# Patient Record
Sex: Male | Born: 1970 | Race: White | Hispanic: No | Marital: Married | Smoking: Former smoker
Health system: Southern US, Community
[De-identification: ages and names within clinical notes are randomized; demographics above are authoritative.]

## PROBLEM LIST (undated history)

## (undated) DIAGNOSIS — E349 Endocrine disorder, unspecified: Secondary | ICD-10-CM

## (undated) DIAGNOSIS — F419 Anxiety disorder, unspecified: Secondary | ICD-10-CM

## (undated) DIAGNOSIS — F329 Major depressive disorder, single episode, unspecified: Secondary | ICD-10-CM

## (undated) DIAGNOSIS — F32A Depression, unspecified: Secondary | ICD-10-CM

## (undated) DIAGNOSIS — I10 Essential (primary) hypertension: Secondary | ICD-10-CM

## (undated) HISTORY — DX: Depression, unspecified: F32.A

## (undated) HISTORY — PX: NOSE SURGERY: SHX723

## (undated) HISTORY — PX: ANKLE SURGERY: SHX546

## (undated) HISTORY — DX: Major depressive disorder, single episode, unspecified: F32.9

## (undated) HISTORY — DX: Anxiety disorder, unspecified: F41.9

## (undated) HISTORY — DX: Essential (primary) hypertension: I10

## (undated) HISTORY — DX: Endocrine disorder, unspecified: E34.9

---

## 2006-07-15 ENCOUNTER — Ambulatory Visit: Payer: Self-pay | Admitting: Specialist

## 2008-04-12 ENCOUNTER — Ambulatory Visit: Payer: Self-pay | Admitting: Family Medicine

## 2012-12-03 ENCOUNTER — Ambulatory Visit: Payer: Self-pay | Admitting: Emergency Medicine

## 2015-01-21 ENCOUNTER — Other Ambulatory Visit: Payer: Self-pay | Admitting: Family Medicine

## 2015-01-21 DIAGNOSIS — I10 Essential (primary) hypertension: Secondary | ICD-10-CM

## 2015-04-30 ENCOUNTER — Ambulatory Visit (INDEPENDENT_AMBULATORY_CARE_PROVIDER_SITE_OTHER): Payer: BLUE CROSS/BLUE SHIELD | Admitting: Family Medicine

## 2015-04-30 ENCOUNTER — Encounter: Payer: Self-pay | Admitting: Family Medicine

## 2015-04-30 VITALS — BP 120/70 | HR 80 | Ht 70.0 in | Wt 266.0 lb

## 2015-04-30 DIAGNOSIS — F331 Major depressive disorder, recurrent, moderate: Secondary | ICD-10-CM | POA: Diagnosis not present

## 2015-04-30 DIAGNOSIS — E291 Testicular hypofunction: Secondary | ICD-10-CM | POA: Diagnosis not present

## 2015-04-30 DIAGNOSIS — I10 Essential (primary) hypertension: Secondary | ICD-10-CM

## 2015-04-30 DIAGNOSIS — E349 Endocrine disorder, unspecified: Secondary | ICD-10-CM

## 2015-04-30 MED ORDER — TESTOSTERONE CYPIONATE 200 MG/ML IM SOLN: INTRAMUSCULAR | Status: DC

## 2015-04-30 MED ORDER — LISINOPRIL 2.5 MG PO TABS: 2.5000 mg | ORAL_TABLET | Freq: Every day | ORAL | Status: DC

## 2015-04-30 NOTE — Progress Notes (Signed)
Name: Steven Carney   MRN: [id number]    DOB: 12/29/1970   Date:04/30/2015       Progress Note  Subjective  Chief Complaint  Chief Complaint  Patient presents with  . Hypertension  . hypotestosteronism  . Depression    psych is retiring    Hypertension This is a chronic problem. The current episode started more than 1 year ago. The problem has been waxing and waning since onset. The problem is controlled. Pertinent negatives include no anxiety, blurred vision, chest pain, headaches, malaise/fatigue, neck pain, orthopnea, palpitations, peripheral edema, PND, shortness of breath or sweats. Agents associated with hypertension include steroids. Past treatments include ACE inhibitors and diuretics. The current treatment provides moderate improvement. There are no compliance problems.  There is no history of angina, kidney disease, CAD/MI, CVA, heart failure, left ventricular hypertrophy, PVD, renovascular disease or retinopathy. There is no history of chronic renal disease.  Depression        This is a chronic problem.  The current episode started 1 to 4 weeks ago.   The problem occurs daily.  Associated symptoms include decreased concentration and fatigue.  Associated symptoms include no helplessness, no hopelessness, does not have insomnia, not irritable, no restlessness, no decreased interest, no appetite change, no body aches, no myalgias, no headaches, no indigestion, not sad and no suicidal ideas.  Treatments tried: multiple meds.   Pertinent negatives include no anxiety.   No problem-specific assessment & plan notes found for this encounter.   Past Medical History  Diagnosis Date  . Depression   . Hypotestosteronism   . Hypertension     Past Surgical History  Procedure Laterality Date  . Ankle surgery      growth removed  . Nose surgery      fracture    Family History  Problem Relation Age of Onset  . Cancer Mother   . Depression Mother   . Diabetes Mother   .  Hypertension Mother   . Depression Brother   . Heart disease Maternal Grandmother   . Hypertension Maternal Grandmother   . Heart disease Paternal Grandmother   . Hypertension Paternal Grandmother     Social History   Social History  . Marital Status: Married    Spouse Name: N/A  . Number of Children: N/A  . Years of Education: N/A   Occupational History  . Not on file.   Social History Main Topics  . Smoking status: Former Research scientist (life sciences)  . Smokeless tobacco: Not on file  . Alcohol Use: No  . Drug Use: No  . Sexual Activity: Yes   Other Topics Concern  . Not on file   Social History Narrative  . No narrative on file    No Known Allergies   Review of Systems  Constitutional: Positive for fatigue. Negative for fever, chills, weight loss, malaise/fatigue and appetite change.  HENT: Negative for ear discharge, ear pain and sore throat.   Eyes: Negative for blurred vision.  Respiratory: Negative for cough, sputum production, shortness of breath and wheezing.   Cardiovascular: Negative for chest pain, palpitations, orthopnea, leg swelling and PND.  Gastrointestinal: Negative for heartburn, nausea, abdominal pain, diarrhea, constipation, blood in stool and melena.  Genitourinary: Negative for dysuria, urgency, frequency and hematuria.  Musculoskeletal: Negative for myalgias, back pain, joint pain and neck pain.  Skin: Negative for rash.  Neurological: Negative for dizziness, tingling, sensory change, focal weakness and headaches.  Endo/Heme/Allergies: Negative for environmental allergies and polydipsia. Does not bruise/bleed  easily.  Psychiatric/Behavioral: Positive for depression and decreased concentration. Negative for suicidal ideas. The patient is not nervous/anxious and does not have insomnia.      Objective  Filed Vitals:   04/30/15 1338  BP: 120/70  Pulse: 80  Height: 5\' 10"  (1.778 m)  Weight: 266 lb (120.657 kg)    Physical Exam  Constitutional: He is  oriented to person, place, and time and well-developed, well-nourished, and in no distress. He is not irritable.  HENT:  Head: Normocephalic.  Right Ear: External ear normal.  Left Ear: External ear normal.  Nose: Nose normal.  Mouth/Throat: Oropharynx is clear and moist.  Eyes: Conjunctivae and EOM are normal. Pupils are equal, round, and reactive to light. Right eye exhibits no discharge. Left eye exhibits no discharge. No scleral icterus.  Neck: Normal range of motion. Neck supple. No JVD present. No tracheal deviation present. No thyromegaly present.  Cardiovascular: Normal rate, regular rhythm, normal heart sounds and intact distal pulses.  Exam reveals no gallop and no friction rub.   No murmur heard. Pulmonary/Chest: Breath sounds normal. No respiratory distress. He has no wheezes. He has no rales.  Abdominal: Soft. Bowel sounds are normal. He exhibits no mass. There is no hepatosplenomegaly. There is no tenderness. There is no rebound, no guarding and no CVA tenderness.  Musculoskeletal: Normal range of motion. He exhibits no edema or tenderness.  Lymphadenopathy:    He has no cervical adenopathy.  Neurological: He is alert and oriented to person, place, and time. He has normal sensation, normal strength, normal reflexes and intact cranial nerves. No cranial nerve deficit.  Skin: Skin is warm. No rash noted.  Psychiatric: Mood and affect normal.      Assessment & Plan  Problem List Items Addressed This Visit    None    Visit Diagnoses    Essential hypertension    -  Primary    Relevant Medications    lisinopril (PRINIVIL,ZESTRIL) 2.5 MG tablet    Other Relevant Orders    Renal Function Panel    Major depressive disorder, recurrent episode, moderate        Relevant Medications    PRISTIQ 100 MG 24 hr tablet    busPIRone (BUSPAR) 15 MG tablet    buPROPion (WELLBUTRIN SR) 200 MG 12 hr tablet    Other Relevant Orders    Ambulatory referral to Psychiatry     Hypotestosteronemia        Relevant Medications    testosterone cypionate (DEPOTESTOSTERONE CYPIONATE) 200 MG/ML injection    Other Relevant Orders    Hepatic function panel    Testosterone         Dr. Otilio Miu [facility]  04/30/2015

## 2015-05-17 ENCOUNTER — Encounter: Payer: Self-pay | Admitting: Psychiatry

## 2015-05-17 ENCOUNTER — Ambulatory Visit (INDEPENDENT_AMBULATORY_CARE_PROVIDER_SITE_OTHER): Payer: BLUE CROSS/BLUE SHIELD | Admitting: Psychiatry

## 2015-05-17 VITALS — BP 140/86 | HR 114 | Temp 98.9°F | Ht 70.0 in | Wt 271.4 lb

## 2015-05-17 DIAGNOSIS — F411 Generalized anxiety disorder: Secondary | ICD-10-CM

## 2015-05-17 DIAGNOSIS — F331 Major depressive disorder, recurrent, moderate: Secondary | ICD-10-CM | POA: Diagnosis not present

## 2015-05-17 NOTE — Progress Notes (Addendum)
Psychiatric Initial Adult Assessment   Patient Identification: Steven Carney MRN:  [id number] Date of Evaluation:  05/17/2015 Referral Source: Psych/NP Chief Complaint:  "For several years, 5-6 years severe, not very severe depression and anxiety" Chief Complaint    Establish Care; Memory Loss     Visit Diagnosis:    ICD-9-CM ICD-10-CM   1. Major depressive disorder, recurrent episode, moderate 296.32 F33.1   2. GAD (generalized anxiety disorder) 300.02 F41.1    Diagnosis:  There are no active problems to display for this patient.  History of Present Illness:  Patient indicates that he has had issues with depression and anxiety for the past 5 or 6 years. He states that his symptoms started with difficulty sleeping, lack of interest, low energy, poor concentration and depressed mood which she describes as feeling "blah." When asked about their duration of his depression he states that it's probably gone on about half of the time over this past 5-6 year. He denies that it's ever gone to the point where he's not been able to function get out of bed and go to work. He states that there were times where he wished she could've stayed in bed and not gone to work. He denies ever developing significant suicidal ideation but states that he has had fleeting thoughts in the past. He states the last time he had it was probably one year ago.  He denies any symptoms of depression and denies any symptoms of mania. Regards to his anxiety states that its generally constant worry. He states it's almost like he is a perfectionist. He describes symptoms as tightness in his body. He denies any triggers or panic attacks. He states state that it is in a crowd and there is a lot of noise he does feel like he needs to get out of that situation.  Since 2010 is gone to [facility] and seen a nurse practitioner there as well as a therapist. He is now transitioning his care because a nurse practitioner is  retiring. He states that he now sees a therapist in [facility].   Elements:  Duration:  As discussed above. Associated Signs/Symptoms: Depression Symptoms:  depressed mood, anhedonia, insomnia, fatigue, difficulty concentrating, anxiety, loss of energy/fatigue, weight gain, (Hypo) Manic Symptoms:  Denies Anxiety Symptoms:  Excessive Worry, Psychotic Symptoms:  denies PTSD Symptoms: Negative  Past Medical History:  Past Medical History  Diagnosis Date  . Depression   . Hypotestosteronism   . Hypertension   . Anxiety     Past Surgical History  Procedure Laterality Date  . Ankle surgery      growth removed  . Nose surgery      fracture   Family History:  Family History  Problem Relation Age of Onset  . Cancer Mother   . Depression Mother   . Diabetes Mother   . Hypertension Mother   . Depression Brother   . Heart disease Maternal Grandmother   . Hypertension Maternal Grandmother   . Heart disease Paternal Grandmother   . Hypertension Paternal Grandmother   . Heart disease Father   . Atrial fibrillation Father    Social History:   Social History   Social History  . Marital Status: Married    Spouse Name: N/A  . Number of Children: N/A  . Years of Education: N/A   Social History Main Topics  . Smoking status: Former Smoker -- 0.50 packs/day    Types: Cigarettes    Start date: 05/16/1993  Quit date: 05/16/1998  . Smokeless tobacco: Never Used  . Alcohol Use: No  . Drug Use: No  . Sexual Activity: Yes    Birth Control/ Protection: None   Other Topics Concern  . None   Social History Narrative   Additional Social History: Patient states that he grew up in the [place] area. He states that both his parents were in the home and he has one younger brother. He denies any traumatic or abusive situations growing up. Patient has gone through graduate school for math education. 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 He has been married for 15 years and he has a daughter age 24 and a son age 30.  Musculoskeletal: Strength & Muscle Tone: within normal limits Gait & Station: normal Patient leans: N/A  Psychiatric Specialty Exam: HPI  Review of Systems  Psychiatric/Behavioral: Negative for depression, suicidal ideas, hallucinations, memory loss and substance abuse. The patient is not nervous/anxious and does not have insomnia.     Blood pressure 140/86, pulse 114, temperature 98.9 F (37.2 C), temperature source Tympanic, height 5\' 10"  (1.778 m), weight 271 lb 6.4 oz (123.106 kg), SpO2 95 %.Body mass index is 38.94 kg/(m^2).  General Appearance: Neat and Well Groomed  Eye Contact:  Good  Speech:  Normal Rate  Volume:  Normal  Mood:  Good  Affect:  Appropriate and Congruent  Thought Process:  Linear and Logical  Orientation:  Full (Time, Place, and Person)  Thought Content:  Negative  Suicidal Thoughts:  No  Homicidal Thoughts:  No  Memory:  Immediate;   Good Recent;   Good Remote;   Good  Judgement:  Good  Insight:  Good  Psychomotor Activity:  Negative  Concentration:  Good  Recall:  Good  Fund of Knowledge:Good  Language: Good  Akathisia:  Negative  Handed:  Right unknown   AIMS (if indicated):  Done 05/17/15, normal   Assets:  Communication Skills Desire for Improvement Social Support Vocational/Educational  ADL's:  Intact  Cognition: WNL  Sleep:  Good, uses CPAP   Is the patient at risk to self?  No. Has the patient been a risk to self in the past 6 months?  No. Has the patient been a risk to self within the distant past?  No. Is the patient a risk to others?  No. Has the patient been a risk to others in the past 6 months?  No. Has the patient been a risk to others within the distant past?  No.  Allergies:  No Known Allergies Current Medications: Current Outpatient Prescriptions  Medication Sig Dispense Refill  . ABILIFY 5 MG tablet Take 1  tablet by mouth daily at 6 (six) AM. Dr Raelyn Ensign    . buPROPion (WELLBUTRIN SR) 200 MG 12 hr tablet Take 1 tablet by mouth 2 (two) times daily. Transu    . busPIRone (BUSPAR) 15 MG tablet Take 1 tablet by mouth 2 (two) times daily. Transu    . lamoTRIgine (LAMICTAL) 150 MG tablet Take 1 tablet by mouth 2 (two) times daily.    Marland Kitchen lisinopril (PRINIVIL,ZESTRIL) 2.5 MG tablet Take 1 tablet (2.5 mg total) by mouth daily. 30 tablet 5  . PRISTIQ 100 MG 24 hr tablet Take 1 tablet by mouth daily at 6 (six) AM. Dr Raelyn Ensign    . testosterone cypionate (DEPOTESTOSTERONE CYPIONATE) 200 MG/ML injection 0.75 cc q wk 10 mL 5   No current facility-administered medications for  this visit.    Previous Psychotropic Medications: Yes  Patient reports past trial of either Prozac or Lexapro but states that did not work. He states that he had a trial of the ricks salty but that did not provide any benefit. He indicates he has not had trials of Seroquel, Effexor, Cymbalta, Zoloft or Paxil. He also relates that the generic form of Abilify did not work and he is only getting a response from the name brand Abilify. Substance Abuse History in the last 12 months:  No. Patient denies any use of alcohol illicit drugs. States he last used alcohol 3 or 4 years ago. He denies any smoking history. Consequences of Substance Abuse: Negative  Medical Decision Making:  Established Problem, Stable/Improving (1) and Review of Medication Regimen & Side Effects (2)  Treatment Plan Summary: Medication management and Plan   Major depressive disorder, recurrent, moderate. Patient reports feeling stable on the current medication regimen. He denies any side effects from the current medication regimen. Thus we will continue these medicines without any changes. He will continue his Abilify 5 mg daily, his bupropion SR 200 mg twice daily, BuSpar 15 mg twice daily, Pristiq 100 mg daily and lamotrigine 150 mg daily. Risk and benefits of all these  medications have been discussed and patient is able to consent to continuing them. In regards to risk assessment the patient has risk factors of being male and race and affective illness. Protective factors are no past suicide attempts, good social supports, minor children living in the home, no past suicide attempts and no substance use disorder. At this time low risk of imminent harm to himself or others.  Metabolic assessment-Spoke with patient on 05/23/2015. Discussed his lab results. His lipid panel was significant for cholesterol slightly elevated at 218, HDL slightly low at 36 and LDL high at 157. His prolactin levels within normal limits at 14.7 and a serum glucose was within normal limits at 87. Inform patient of these results. Discussed that he can follow with his primary care physician in regards to any interventions.  Faith Rogue 9/30/20169:34 AM

## 2015-05-20 ENCOUNTER — Other Ambulatory Visit: Payer: Self-pay | Admitting: Psychiatry

## 2015-05-21 LAB — RENAL FUNCTION PANEL
ALBUMIN: 4.8 g/dL (ref 3.5–5.5)
BUN/Creatinine Ratio: 9 (ref 9–20)
BUN: 14 mg/dL (ref 6–24)
CALCIUM: 9.7 mg/dL (ref 8.7–10.2)
CHLORIDE: 98 mmol/L (ref 97–108)
CO2: 25 mmol/L (ref 18–29)
Creatinine, Ser: 1.55 mg/dL — ABNORMAL HIGH (ref 0.76–1.27)
GFR calc Af Amer: 63 mL/min/{1.73_m2} (ref >59)
GFR calc non Af Amer: 55 mL/min/{1.73_m2} — ABNORMAL LOW (ref >59)
GLUCOSE: 87 mg/dL (ref 65–99)
PHOSPHORUS: 2.6 mg/dL (ref 2.5–4.5)
POTASSIUM: 5 mmol/L (ref 3.5–5.2)
Sodium: 140 mmol/L (ref 134–144)

## 2015-05-21 LAB — HEPATIC FUNCTION PANEL
ALK PHOS: 65 IU/L (ref 39–117)
ALT: 49 IU/L — ABNORMAL HIGH (ref 0–44)
AST: 30 IU/L (ref 0–40)
BILIRUBIN TOTAL: 0.8 mg/dL (ref 0.0–1.2)
BILIRUBIN, DIRECT: 0.14 mg/dL (ref 0.00–0.40)
TOTAL PROTEIN: 7.3 g/dL (ref 6.0–8.5)

## 2015-05-21 LAB — LIPID PANEL W/O CHOL/HDL RATIO
Cholesterol, Total: 218 mg/dL — ABNORMAL HIGH (ref 100–199)
HDL: 36 mg/dL — AB (ref >39)
LDL CALC: 157 mg/dL — AB (ref 0–99)
TRIGLYCERIDES: 127 mg/dL (ref 0–149)
VLDL CHOLESTEROL CAL: 25 mg/dL (ref 5–40)

## 2015-05-21 LAB — TESTOSTERONE: TESTOSTERONE: 528 ng/dL (ref 348–1197)

## 2015-05-21 LAB — PROLACTIN: PROLACTIN: 14.7 ng/mL (ref 4.0–15.2)

## 2015-05-21 LAB — GLUCOSE, RANDOM: Glucose: 87 mg/dL (ref 65–99)

## 2015-06-18 ENCOUNTER — Other Ambulatory Visit: Payer: Self-pay

## 2015-06-21 ENCOUNTER — Ambulatory Visit: Payer: Self-pay | Admitting: Psychiatry

## 2015-07-02 ENCOUNTER — Ambulatory Visit (INDEPENDENT_AMBULATORY_CARE_PROVIDER_SITE_OTHER): Payer: BLUE CROSS/BLUE SHIELD | Admitting: Family Medicine

## 2015-07-02 VITALS — BP 120/98 | HR 100

## 2015-07-02 DIAGNOSIS — I10 Essential (primary) hypertension: Secondary | ICD-10-CM | POA: Diagnosis not present

## 2015-07-02 MED ORDER — LISINOPRIL 10 MG PO TABS: 10.0000 mg | ORAL_TABLET | Freq: Every day | ORAL | Status: DC

## 2015-07-02 NOTE — Progress Notes (Signed)
Name: Steven Carney   MRN: [id number]    DOB: [birth year]   Date:07/02/2015       Progress Note  Subjective  Chief Complaint  Chief Complaint  Patient presents with  . Hypertension    Hypertension This is a chronic problem. The current episode started more than 1 year ago. Associated symptoms include anxiety. Pertinent negatives include no blurred vision, chest pain, headaches, malaise/fatigue, neck pain, orthopnea, palpitations, peripheral edema, PND, shortness of breath or sweats. There are no associated agents to hypertension. There are no known risk factors for coronary artery disease. Past treatments include ACE inhibitors. The current treatment provides no improvement. There are no compliance problems.  There is no history of angina, kidney disease, CAD/MI, CVA, heart failure, left ventricular hypertrophy, PVD, renovascular disease or retinopathy. There is no history of chronic renal disease or a hypertension causing med.    No problem-specific assessment & plan notes found for this encounter.   Past Medical History  Diagnosis Date  . Depression   . Hypotestosteronism   . Hypertension   . Anxiety     Past Surgical History  Procedure Laterality Date  . Ankle surgery      growth removed  . Nose surgery      fracture    Family History  Problem Relation Age of Onset  . Cancer Mother   . Depression Mother   . Diabetes Mother   . Hypertension Mother   . Depression Brother   . Heart disease Maternal Grandmother   . Hypertension Maternal Grandmother   . Heart disease Paternal Grandmother   . Hypertension Paternal Grandmother   . Heart disease Father   . Atrial fibrillation Father     Social History   Social History  . Marital Status: Married    Spouse Name: N/A  . Number of Children: N/A  . Years of Education: N/A   Occupational History  . Not on file.   Social History Main Topics  . Smoking status: Former Smoker -- 0.50 packs/day    Types: Cigarettes     Start date: 05/16/1993    Quit date: 05/16/1998  . Smokeless tobacco: Never Used  . Alcohol Use: No  . Drug Use: No  . Sexual Activity: Yes    Birth Control/ Protection: None   Other Topics Concern  . Not on file   Social History Narrative    No Known Allergies   Review of Systems  Constitutional: Negative for fever, chills, weight loss and malaise/fatigue.  HENT: Negative for ear discharge, ear pain and sore throat.   Eyes: Negative for blurred vision.  Respiratory: Negative for cough, sputum production, shortness of breath and wheezing.   Cardiovascular: Negative for chest pain, palpitations, orthopnea, claudication, leg swelling and PND.  Gastrointestinal: Negative for heartburn, nausea, abdominal pain, diarrhea, constipation, blood in stool and melena.  Genitourinary: Negative for dysuria, urgency, frequency and hematuria.  Musculoskeletal: Negative for myalgias, back pain, joint pain and neck pain.  Skin: Negative for rash.  Neurological: Negative for dizziness, tingling, sensory change, focal weakness and headaches.  Endo/Heme/Allergies: Negative for environmental allergies and polydipsia. Does not bruise/bleed easily.  Psychiatric/Behavioral: Positive for depression. Negative for suicidal ideas and substance abuse. The patient is nervous/anxious. The patient does not have insomnia.      Objective  Filed Vitals:   07/02/15 0813  BP: 120/98  Pulse: 100    Physical Exam  Constitutional: He is oriented to person, place, and time and well-developed, well-nourished, and in  no distress.  HENT:  Head: Normocephalic.  Right Ear: External ear normal.  Left Ear: External ear normal.  Nose: Nose normal.  Mouth/Throat: Oropharynx is clear and moist.  Eyes: Conjunctivae and EOM are normal. Pupils are equal, round, and reactive to light. Right eye exhibits no discharge. Left eye exhibits no discharge. No scleral icterus.  Neck: Normal range of motion. Neck supple. No JVD  present. No tracheal deviation present. No thyromegaly present.  Cardiovascular: Normal rate, regular rhythm, normal heart sounds and intact distal pulses.  Exam reveals no gallop and no friction rub.   No murmur heard. Pulmonary/Chest: Breath sounds normal. No respiratory distress. He has no wheezes. He has no rales.  Abdominal: Soft. Bowel sounds are normal. He exhibits no mass. There is no hepatosplenomegaly. There is no tenderness. There is no rebound, no guarding and no CVA tenderness.  Musculoskeletal: Normal range of motion. He exhibits no edema or tenderness.  Lymphadenopathy:    He has no cervical adenopathy.  Neurological: He is alert and oriented to person, place, and time. He has normal sensation, normal strength, normal reflexes and intact cranial nerves. No cranial nerve deficit.  Skin: Skin is warm. No rash noted.  Psychiatric: Mood and affect normal.  Nursing note and vitals reviewed.     Assessment & Plan  Problem List Items Addressed This Visit      Cardiovascular and Mediastinum   Essential hypertension - Primary   Relevant Medications   lisinopril (PRINIVIL,ZESTRIL) 10 MG tablet        Dr. Macon [facility]  07/02/2015

## 2015-07-24 ENCOUNTER — Other Ambulatory Visit: Payer: Self-pay

## 2015-07-26 ENCOUNTER — Other Ambulatory Visit: Payer: Self-pay

## 2015-07-26 DIAGNOSIS — G4730 Sleep apnea, unspecified: Secondary | ICD-10-CM

## 2015-08-06 ENCOUNTER — Other Ambulatory Visit: Payer: BLUE CROSS/BLUE SHIELD

## 2015-08-06 DIAGNOSIS — E349 Endocrine disorder, unspecified: Secondary | ICD-10-CM

## 2015-08-08 ENCOUNTER — Other Ambulatory Visit: Payer: Self-pay

## 2015-08-08 LAB — TESTOSTERONE, FREE, TOTAL, SHBG: TESTOSTERONE FREE: 6.2 pg/mL — AB (ref 6.8–21.5)

## 2015-08-13 ENCOUNTER — Ambulatory Visit: Payer: BLUE CROSS/BLUE SHIELD | Attending: Specialist

## 2015-08-13 DIAGNOSIS — G4733 Obstructive sleep apnea (adult) (pediatric): Secondary | ICD-10-CM | POA: Diagnosis not present

## 2015-08-20 ENCOUNTER — Ambulatory Visit: Payer: BLUE CROSS/BLUE SHIELD | Attending: Specialist

## 2015-08-20 DIAGNOSIS — G4733 Obstructive sleep apnea (adult) (pediatric): Secondary | ICD-10-CM | POA: Diagnosis not present

## 2015-08-20 DIAGNOSIS — G4761 Periodic limb movement disorder: Secondary | ICD-10-CM | POA: Insufficient documentation

## 2015-08-23 ENCOUNTER — Other Ambulatory Visit: Payer: Self-pay

## 2015-09-11 ENCOUNTER — Other Ambulatory Visit: Payer: Self-pay

## 2015-09-19 ENCOUNTER — Other Ambulatory Visit: Payer: Self-pay

## 2015-11-22 DIAGNOSIS — G4733 Obstructive sleep apnea (adult) (pediatric): Secondary | ICD-10-CM | POA: Diagnosis not present

## 2015-12-18 DIAGNOSIS — F411 Generalized anxiety disorder: Secondary | ICD-10-CM | POA: Diagnosis not present

## 2015-12-18 DIAGNOSIS — F331 Major depressive disorder, recurrent, moderate: Secondary | ICD-10-CM | POA: Diagnosis not present

## 2015-12-22 DIAGNOSIS — G4733 Obstructive sleep apnea (adult) (pediatric): Secondary | ICD-10-CM | POA: Diagnosis not present

## 2016-01-19 DIAGNOSIS — F5081 Binge eating disorder: Secondary | ICD-10-CM | POA: Diagnosis not present

## 2016-01-20 DIAGNOSIS — F5081 Binge eating disorder: Secondary | ICD-10-CM | POA: Diagnosis not present

## 2016-02-02 DIAGNOSIS — F5081 Binge eating disorder: Secondary | ICD-10-CM | POA: Diagnosis not present

## 2016-02-03 DIAGNOSIS — F5081 Binge eating disorder: Secondary | ICD-10-CM | POA: Diagnosis not present

## 2016-02-10 ENCOUNTER — Other Ambulatory Visit: Payer: Self-pay | Admitting: Family Medicine

## 2016-02-17 DIAGNOSIS — F419 Anxiety disorder, unspecified: Secondary | ICD-10-CM | POA: Diagnosis not present

## 2016-03-02 DIAGNOSIS — F419 Anxiety disorder, unspecified: Secondary | ICD-10-CM | POA: Diagnosis not present

## 2016-03-10 DIAGNOSIS — H01009 Unspecified blepharitis unspecified eye, unspecified eyelid: Secondary | ICD-10-CM | POA: Diagnosis not present

## 2016-03-10 DIAGNOSIS — H5203 Hypermetropia, bilateral: Secondary | ICD-10-CM | POA: Diagnosis not present

## 2016-03-10 DIAGNOSIS — H52223 Regular astigmatism, bilateral: Secondary | ICD-10-CM | POA: Diagnosis not present

## 2016-03-13 ENCOUNTER — Encounter: Payer: Self-pay | Admitting: Family Medicine

## 2016-03-13 ENCOUNTER — Ambulatory Visit (INDEPENDENT_AMBULATORY_CARE_PROVIDER_SITE_OTHER): Payer: BLUE CROSS/BLUE SHIELD | Admitting: Family Medicine

## 2016-03-13 VITALS — BP 100/62 | HR 70 | Ht 70.0 in | Wt 263.0 lb

## 2016-03-13 DIAGNOSIS — I10 Essential (primary) hypertension: Secondary | ICD-10-CM | POA: Diagnosis not present

## 2016-03-13 DIAGNOSIS — R69 Illness, unspecified: Secondary | ICD-10-CM | POA: Diagnosis not present

## 2016-03-13 DIAGNOSIS — E291 Testicular hypofunction: Secondary | ICD-10-CM | POA: Diagnosis not present

## 2016-03-13 DIAGNOSIS — E349 Endocrine disorder, unspecified: Secondary | ICD-10-CM

## 2016-03-13 MED ORDER — TESTOSTERONE CYPIONATE 200 MG/ML IM SOLN: INTRAMUSCULAR | 2 refills | Status: DC

## 2016-03-13 MED ORDER — LISINOPRIL 10 MG PO TABS: 10.0000 mg | ORAL_TABLET | Freq: Every day | ORAL | 1 refills | Status: DC

## 2016-03-13 NOTE — Progress Notes (Signed)
Name: Steven Carney   MRN: [id number]    DOB: 06/14/1971   Date:03/13/2016       Progress Note  Subjective  Chief Complaint  Chief Complaint  Patient presents with  . Hypertension  . hypotestosteronism    Patient present for evaluation and treatment for hypotestosterone.   Hypertension  This is a chronic problem. The current episode started more than 1 year ago. The problem has been gradually improving since onset. The problem is controlled. Pertinent negatives include no anxiety, blurred vision, chest pain, headaches, malaise/fatigue, neck pain, orthopnea, palpitations, peripheral edema, PND, shortness of breath or sweats. There are no associated agents to hypertension. Past treatments include ACE inhibitors. The current treatment provides mild improvement. There are no compliance problems.  There is no history of angina, kidney disease, CAD/MI, CVA, heart failure, left ventricular hypertrophy, PVD, renovascular disease or retinopathy. There is no history of chronic renal disease or a hypertension causing med.    No problem-specific Assessment & Plan notes found for this encounter.   Past Medical History:  Diagnosis Date  . Anxiety   . Depression   . Hypertension   . Hypotestosteronism     Past Surgical History:  Procedure Laterality Date  . ANKLE SURGERY     growth removed  . NOSE SURGERY     fracture    Family History  Problem Relation Age of Onset  . Cancer Mother   . Depression Mother   . Diabetes Mother   . Hypertension Mother   . Depression Brother   . Heart disease Father   . Atrial fibrillation Father   . Heart disease Maternal Grandmother   . Hypertension Maternal Grandmother   . Heart disease Paternal Grandmother   . Hypertension Paternal Grandmother     Social History   Social History  . Marital status: Married    Spouse name: N/A  . Number of children: N/A  . Years of education: N/A   Occupational History  . Not on file.   Social  History Main Topics  . Smoking status: Former Smoker    Packs/day: 0.50    Types: Cigarettes    Start date: 05/16/1993    Quit date: 05/16/1998  . Smokeless tobacco: Current User    Types: Snuff     Comment: pt doesn't want material- just counseled  . Alcohol use No  . Drug use: No  . Sexual activity: Yes    Birth control/ protection: None   Other Topics Concern  . Not on file   Social History Narrative  . No narrative on file    No Known Allergies   Review of Systems  Constitutional: Negative for chills, fever, malaise/fatigue and weight loss.  HENT: Negative for ear discharge, ear pain and sore throat.   Eyes: Negative for blurred vision.  Respiratory: Negative for cough, sputum production, shortness of breath and wheezing.   Cardiovascular: Negative for chest pain, palpitations, orthopnea, leg swelling and PND.  Gastrointestinal: Negative for abdominal pain, blood in stool, constipation, diarrhea, heartburn, melena and nausea.  Genitourinary: Negative for dysuria, frequency, hematuria and urgency.  Musculoskeletal: Negative for back pain, joint pain, myalgias and neck pain.  Skin: Negative for rash.  Neurological: Negative for dizziness, tingling, sensory change, focal weakness and headaches.  Endo/Heme/Allergies: Negative for environmental allergies and polydipsia. Does not bruise/bleed easily.  Psychiatric/Behavioral: Negative for depression and suicidal ideas. The patient is not nervous/anxious and does not have insomnia.      Objective  Vitals:  03/13/16 0929  BP: 100/62  Pulse: 70  Weight: 263 lb (119.3 kg)  Height: 5\' 10"  (1.778 m)    Physical Exam  Constitutional: He is oriented to person, place, and time and well-developed, well-nourished, and in no distress.  HENT:  Head: Normocephalic.  Right Ear: Tympanic membrane, external ear and ear canal normal.  Left Ear: Tympanic membrane, external ear and ear canal normal.  Nose: Nose normal.  Mouth/Throat:  Oropharynx is clear and moist.  Eyes: Conjunctivae and EOM are normal. Pupils are equal, round, and reactive to light. Right eye exhibits no discharge. Left eye exhibits no discharge. No scleral icterus.  Neck: Normal range of motion. Neck supple. No JVD present. No tracheal deviation present. No thyromegaly present.  Cardiovascular: Normal rate, regular rhythm, normal heart sounds and intact distal pulses.  Exam reveals no gallop and no friction rub.   No murmur heard. Pulmonary/Chest: Breath sounds normal. No respiratory distress. He has no wheezes. He has no rales.  Abdominal: Soft. Bowel sounds are normal. He exhibits no mass. There is no hepatosplenomegaly. There is no tenderness. There is no rebound, no guarding and no CVA tenderness.  Musculoskeletal: Normal range of motion. He exhibits no edema or tenderness.  Lymphadenopathy:    He has no cervical adenopathy.  Neurological: He is alert and oriented to person, place, and time. He has normal sensation, normal strength, normal reflexes and intact cranial nerves. No cranial nerve deficit.  Skin: Skin is warm. No rash noted.  Psychiatric: Mood and affect normal.  Nursing note and vitals reviewed.     Assessment & Plan  Problem List Items Addressed This Visit      Cardiovascular and Mediastinum   Essential hypertension - Primary   Relevant Medications   lisinopril (PRINIVIL,ZESTRIL) 10 MG tablet     Endocrine   Hypotestosteronemia   Relevant Medications   testosterone cypionate (DEPOTESTOSTERONE CYPIONATE) 200 MG/ML injection     Other   Taking medication for chronic disease   Relevant Orders   Lipid Profile   Hemoglobin   AST    Other Visit Diagnoses   None.       Dr. Mac[facility]  03/13/16

## 2016-03-16 DIAGNOSIS — F419 Anxiety disorder, unspecified: Secondary | ICD-10-CM | POA: Diagnosis not present

## 2016-03-17 DIAGNOSIS — R69 Illness, unspecified: Secondary | ICD-10-CM | POA: Diagnosis not present

## 2016-03-18 LAB — LIPID PANEL
CHOL/HDL RATIO: 4.6 ratio (ref 0.0–5.0)
CHOLESTEROL TOTAL: 180 mg/dL (ref 100–199)
HDL: 39 mg/dL — AB (ref >39)
LDL Calculated: 118 mg/dL — ABNORMAL HIGH (ref 0–99)
TRIGLYCERIDES: 114 mg/dL (ref 0–149)
VLDL Cholesterol Cal: 23 mg/dL (ref 5–40)

## 2016-03-18 LAB — HEMOGLOBIN: Hemoglobin: 16.3 g/dL (ref 12.6–17.7)

## 2016-03-18 LAB — AST: AST: 24 IU/L (ref 0–40)

## 2016-03-25 DIAGNOSIS — F411 Generalized anxiety disorder: Secondary | ICD-10-CM | POA: Diagnosis not present

## 2016-03-25 DIAGNOSIS — F331 Major depressive disorder, recurrent, moderate: Secondary | ICD-10-CM | POA: Diagnosis not present

## 2016-04-03 ENCOUNTER — Other Ambulatory Visit: Payer: Self-pay

## 2016-04-03 DIAGNOSIS — I10 Essential (primary) hypertension: Secondary | ICD-10-CM

## 2016-04-03 MED ORDER — LISINOPRIL 10 MG PO TABS: 10.0000 mg | ORAL_TABLET | Freq: Every day | ORAL | 1 refills | Status: DC

## 2016-04-27 DIAGNOSIS — F5081 Binge eating disorder: Secondary | ICD-10-CM | POA: Diagnosis not present

## 2016-05-04 DIAGNOSIS — G4733 Obstructive sleep apnea (adult) (pediatric): Secondary | ICD-10-CM | POA: Diagnosis not present

## 2016-05-11 DIAGNOSIS — F5081 Binge eating disorder: Secondary | ICD-10-CM | POA: Diagnosis not present

## 2016-06-12 ENCOUNTER — Ambulatory Visit (INDEPENDENT_AMBULATORY_CARE_PROVIDER_SITE_OTHER): Payer: BLUE CROSS/BLUE SHIELD | Admitting: Family Medicine

## 2016-06-12 ENCOUNTER — Encounter: Payer: Self-pay | Admitting: Family Medicine

## 2016-06-12 VITALS — BP 122/84 | HR 72 | Temp 99.4°F | Ht 70.0 in | Wt 272.0 lb

## 2016-06-12 DIAGNOSIS — L739 Follicular disorder, unspecified: Secondary | ICD-10-CM | POA: Diagnosis not present

## 2016-06-12 MED ORDER — MUPIROCIN CALCIUM 2 % EX CREA: 1.0000 "application " | TOPICAL_CREAM | Freq: Two times a day (BID) | CUTANEOUS | 1 refills | Status: DC

## 2016-06-12 MED ORDER — SULFAMETHOXAZOLE-TRIMETHOPRIM 800-160 MG PO TABS: 1.0000 | ORAL_TABLET | Freq: Two times a day (BID) | ORAL | 0 refills | Status: DC

## 2016-06-12 NOTE — Progress Notes (Signed)
Name: Steven Carney   MRN: [id number]    DOB: November 29, 1970   Date:06/12/2016       Progress Note  Subjective  Chief Complaint  Chief Complaint  Patient presents with  . Rash    on L) side of face. Came up as 2 seperate places on Monday. Put hydrocortisone cream on places and it made them worse especially after shaving. The areas appeared to be "oozing" so tried putting Peroxide on yesterday which helped to "dry them up some"    Rash  This is a new problem. The current episode started in the past 7 days. The problem has been gradually worsening since onset. The affected locations include the face. The rash is characterized by itchiness and redness (drainage). He was exposed to nothing. Pertinent negatives include no anorexia, congestion, cough, diarrhea, eye pain, facial edema, fatigue, fever, joint pain, nail changes, rhinorrhea, shortness of breath, sore throat or vomiting. Past treatments include nothing. The treatment provided no relief.  Otalgia   There is pain in the left ear. This is a chronic problem. The current episode started in the past 7 days. The problem occurs constantly. The maximum temperature recorded prior to his arrival was 100.4 - 100.9 F. The pain is mild. Associated symptoms include a rash. Pertinent negatives include no abdominal pain, coughing, diarrhea, ear discharge, headaches, neck pain, rhinorrhea, sore throat or vomiting. The treatment provided mild relief.    No problem-specific Assessment & Plan notes found for this encounter.   Past Medical History:  Diagnosis Date  . Anxiety   . Depression   . Hypertension   . Hypotestosteronism     Past Surgical History:  Procedure Laterality Date  . ANKLE SURGERY     growth removed  . NOSE SURGERY     fracture    Family History  Problem Relation Age of Onset  . Cancer Mother   . Depression Mother   . Diabetes Mother   . Hypertension Mother   . Depression Brother   . Heart disease Father   . Atrial  fibrillation Father   . Heart disease Maternal Grandmother   . Hypertension Maternal Grandmother   . Heart disease Paternal Grandmother   . Hypertension Paternal Grandmother     Social History   Social History  . Marital status: Married    Spouse name: N/A  . Number of children: N/A  . Years of education: N/A   Occupational History  . Not on file.   Social History Main Topics  . Smoking status: Former Smoker    Packs/day: 0.50    Types: Cigarettes    Start date: 05/16/1993    Quit date: 05/16/1998  . Smokeless tobacco: Current User    Types: Snuff     Comment: pt doesn't want material- just counseled  . Alcohol use No  . Drug use: No  . Sexual activity: Yes    Birth control/ protection: None   Other Topics Concern  . Not on file   Social History Narrative  . No narrative on file    No Known Allergies   Review of Systems  Constitutional: Negative for chills, fatigue, fever, malaise/fatigue and weight loss.  HENT: Negative for congestion, ear discharge, ear pain, rhinorrhea and sore throat.   Eyes: Negative for blurred vision and pain.  Respiratory: Negative for cough, sputum production, shortness of breath and wheezing.   Cardiovascular: Negative for chest pain, palpitations and leg swelling.  Gastrointestinal: Negative for abdominal pain, anorexia, blood in  stool, constipation, diarrhea, heartburn, melena, nausea and vomiting.  Genitourinary: Negative for dysuria, frequency, hematuria and urgency.  Musculoskeletal: Negative for back pain, joint pain, myalgias and neck pain.  Skin: Positive for rash. Negative for nail changes.  Neurological: Negative for dizziness, tingling, sensory change, focal weakness and headaches.  Endo/Heme/Allergies: Negative for environmental allergies and polydipsia. Does not bruise/bleed easily.  Psychiatric/Behavioral: Negative for depression and suicidal ideas. The patient is not nervous/anxious and does not have insomnia.       Objective  Vitals:   06/12/16 1028  BP: 122/84  Pulse: 72  Temp: 99.4 F (37.4 C)  TempSrc: Oral  Weight: 272 lb (123.4 kg)  Height: 5\' 10"  (1.778 m)    Physical Exam  Constitutional: He is oriented to person, place, and time and well-developed, well-nourished, and in no distress.  HENT:  Head: Normocephalic.  Right Ear: Tympanic membrane, external ear and ear canal normal.  Left Ear: External ear normal. There is hemotympanum.  Nose: Nose normal.  Mouth/Throat: Oropharynx is clear and moist.  Eyes: Conjunctivae and EOM are normal. Pupils are equal, round, and reactive to light. Right eye exhibits no discharge. Left eye exhibits no discharge. No scleral icterus.  Neck: Normal range of motion. Neck supple. No JVD present. No tracheal deviation present. No thyromegaly present.  Cardiovascular: Normal rate, regular rhythm, normal heart sounds and intact distal pulses.  Exam reveals no gallop and no friction rub.   No murmur heard. Pulmonary/Chest: Breath sounds normal. No respiratory distress. He has no wheezes. He has no rales.  Abdominal: Soft. Bowel sounds are normal. He exhibits no mass. There is no hepatosplenomegaly. There is no tenderness. There is no rebound, no guarding and no CVA tenderness.  Musculoskeletal: Normal range of motion. He exhibits no edema or tenderness.  Lymphadenopathy:    He has no cervical adenopathy.  Neurological: He is alert and oriented to person, place, and time. He has normal sensation, normal strength, normal reflexes and intact cranial nerves. No cranial nerve deficit.  Skin: Skin is warm. Rash noted. Rash is pustular.  Psychiatric: Mood and affect normal.  Nursing note and vitals reviewed.     Assessment & Plan  Problem List Items Addressed This Visit    None    Visit Diagnoses    Folliculitis    -  Primary   Relevant Medications   sulfamethoxazole-trimethoprim (BACTRIM DS,SEPTRA DS) 800-160 MG tablet   mupirocin cream  (BACTROBAN) 2 %        Dr. [facility]  06/12/16

## 2016-06-14 ENCOUNTER — Encounter: Payer: Self-pay | Admitting: Emergency Medicine

## 2016-06-14 ENCOUNTER — Emergency Department
Admission: EM | Admit: 2016-06-14 | Discharge: 2016-06-14 | Disposition: A | Discharge status: Home / Self Care | Payer: BLUE CROSS/BLUE SHIELD | Attending: Emergency Medicine | Admitting: Emergency Medicine

## 2016-06-14 DIAGNOSIS — B001 Herpesviral vesicular dermatitis: Secondary | ICD-10-CM | POA: Diagnosis not present

## 2016-06-14 DIAGNOSIS — F17290 Nicotine dependence, other tobacco product, uncomplicated: Secondary | ICD-10-CM | POA: Diagnosis not present

## 2016-06-14 DIAGNOSIS — B0229 Other postherpetic nervous system involvement: Secondary | ICD-10-CM

## 2016-06-14 DIAGNOSIS — B0221 Postherpetic geniculate ganglionitis: Secondary | ICD-10-CM | POA: Diagnosis not present

## 2016-06-14 DIAGNOSIS — L03211 Cellulitis of face: Secondary | ICD-10-CM | POA: Diagnosis not present

## 2016-06-14 DIAGNOSIS — I10 Essential (primary) hypertension: Secondary | ICD-10-CM | POA: Diagnosis not present

## 2016-06-14 DIAGNOSIS — Z79899 Other long term (current) drug therapy: Secondary | ICD-10-CM | POA: Insufficient documentation

## 2016-06-14 DIAGNOSIS — R21 Rash and other nonspecific skin eruption: Secondary | ICD-10-CM | POA: Diagnosis present

## 2016-06-14 DIAGNOSIS — B0222 Postherpetic trigeminal neuralgia: Secondary | ICD-10-CM | POA: Diagnosis not present

## 2016-06-14 MED ORDER — CLINDAMYCIN HCL 300 MG PO CAPS: 300.0000 mg | ORAL_CAPSULE | Freq: Three times a day (TID) | ORAL | 0 refills | Status: DC

## 2016-06-14 MED ORDER — CLINDAMYCIN HCL 150 MG PO CAPS: 150.0000 mg | ORAL_CAPSULE | Freq: Once | ORAL | Status: DC

## 2016-06-14 MED ORDER — CLINDAMYCIN HCL 150 MG PO CAPS
300.0000 mg | ORAL_CAPSULE | Freq: Once | ORAL | Status: AC
  Administered 2016-06-14: 300 mg via ORAL
  Filled 2016-06-14: qty 2

## 2016-06-14 MED ORDER — VALACYCLOVIR HCL 1 G PO TABS: 1000.0000 mg | ORAL_TABLET | Freq: Three times a day (TID) | ORAL | 0 refills | Status: AC

## 2016-06-14 MED ORDER — VALACYCLOVIR HCL 500 MG PO TABS
1000.0000 mg | ORAL_TABLET | Freq: Once | ORAL | Status: AC
  Administered 2016-06-14: 1000 mg via ORAL
  Filled 2016-06-14: qty 2

## 2016-06-14 NOTE — ED Triage Notes (Signed)
reddened areas to left face from chin to temple.  +1 swelling to left cheek.  Several red areas with black centers.

## 2016-06-14 NOTE — ED Provider Notes (Signed)
[facility] Emergency Department Provider Note ____________________________________________   I have reviewed the triage vital signs and the triage nursing note.  HISTORY  Chief Complaint Rash   Historian Patient  HPI Steven Carney is a 45 y.o. male who is here for evaluation of left facial rash. He noticed that it started on last Sunday, a ago now as 2 small bumps that had a clear type liquid that appeared to him like possible contact dermatitis although it wasn't particularly itchy and he had no known exposures. After a few days it started to look red and swollen and he saw his primary care doctor, Dr. Ronnald Ramp who prescribed him Bactrim which he started on Friday. He's now had a day and a half of therapy and he feels like the redness is worse as is the swelling of the cheek. He is also having pain in his left ear. He is having some pain on his left lip.  Pain is moderate, swelling is moderate. Patient states he would've came in but his family members wanted him to get checked out again. He thinks he had a mild fever a few days ago, but no fever today. No cough or trouble breathing. No trouble swallowing. No headache, confusion or altered mental status.    Past Medical History:  Diagnosis Date  . Anxiety   . Depression   . Hypertension   . Hypotestosteronism     Patient Active Problem List   Diagnosis Date Noted  . Hypotestosteronemia 03/13/2016  . Taking medication for chronic disease 03/13/2016  . Essential hypertension 07/02/2015    Past Surgical History:  Procedure Laterality Date  . ANKLE SURGERY     growth removed  . NOSE SURGERY     fracture    Prior to Admission medications   Medication Sig Start Date End Date Taking? Authorizing Provider  ABILIFY 5 MG tablet Take 1 tablet by mouth daily at 6 (six) AM. Dr Raelyn Ensign 04/19/15   Historical Provider, MD  buPROPion (WELLBUTRIN SR) 200 MG 12 hr tablet Take 1 tablet by mouth 2 (two) times daily.  Transu 04/08/15   Historical Provider, MD  busPIRone (BUSPAR) 15 MG tablet Take 1 tablet by mouth 2 (two) times daily. Transu 04/19/15   Historical Provider, MD  clindamycin (CLEOCIN) 300 MG capsule Take 1 capsule (300 mg total) by mouth 3 (three) times daily. 06/14/16   Lisa Roca, MD  lamoTRIgine (LAMICTAL) 150 MG tablet Take 1 tablet by mouth 2 (two) times daily. 04/19/15   Historical Provider, MD  lisinopril (PRINIVIL,ZESTRIL) 10 MG tablet Take 1 tablet (10 mg total) by mouth daily. 04/03/16   Juline Patch, MD  mupirocin cream (BACTROBAN) 2 % Apply 1 application topically 2 (two) times daily. 06/12/16   Juline Patch, MD  PRISTIQ 100 MG 24 hr tablet Take 1 tablet by mouth daily at 6 (six) AM. Dr Raelyn Ensign 04/19/15   Historical Provider, MD  sulfamethoxazole-trimethoprim (BACTRIM DS,SEPTRA DS) 800-160 MG tablet Take 1 tablet by mouth 2 (two) times daily. 06/12/16   Juline Patch, MD  testosterone cypionate (DEPOTESTOSTERONE CYPIONATE) 200 MG/ML injection 0.75 cc q wk 03/13/16   Juline Patch, MD  valACYclovir (VALTREX) 1000 MG tablet Take 1 tablet (1,000 mg total) by mouth 3 (three) times daily. 06/14/16 06/24/16  Lisa Roca, MD    No Known Allergies  Family History  Problem Relation Age of Onset  . Cancer Mother   . Depression Mother   . Diabetes Mother   .  Hypertension Mother   . Depression Brother   . Heart disease Father   . Atrial fibrillation Father   . Heart disease Maternal Grandmother   . Hypertension Maternal Grandmother   . Heart disease Paternal Grandmother   . Hypertension Paternal Grandmother     Social History Social History  Substance Use Topics  . Smoking status: Former Smoker    Packs/day: 0.50    Types: Cigarettes    Start date: 05/16/1993    Quit date: 05/16/1998  . Smokeless tobacco: Current User    Types: Snuff     Comment: pt doesn't want material- just counseled  . Alcohol use No    Review of Systems  Constitutional: Currently under treatment for  cellulitis of the left face. Eyes: Negative for visual changes. ENT: Negative for sore throat.  No ear pain as above. Cardiovascular: Negative for chest pain. Respiratory: Negative for shortness of breath. Gastrointestinal: Negative for abdominal pain, vomiting and diarrhea. Genitourinary: Negative for dysuria. Musculoskeletal: Negative for back pain. Skin: Left facial rash. Neurological: Negative for headache. 10 point Review of Systems otherwise negative ____________________________________________   PHYSICAL EXAM:  VITAL SIGNS: ED Triage Vitals  Enc Vitals Group     BP 06/14/16 0851 (!) 159/107     Pulse Rate 06/14/16 0851 (!) 101     Resp 06/14/16 0851 18     Temp 06/14/16 0851 98 F (36.7 C)     Temp Source 06/14/16 0851 Oral     SpO2 06/14/16 0851 99 %     Weight 06/14/16 0849 260 lb (117.9 kg)     Height 06/14/16 0849 5\' 10"  (1.778 m)     Head Circumference --      Peak Flow --      Pain Score 06/14/16 0849 3     Pain Loc --      Pain Edu? --      Excl. in [place]? --      Constitutional: Alert and oriented. Well appearing and in no distress. HEENT   Head: Normocephalic and atraumatic. See skin exam      Eyes: Conjunctivae are normal. PERRL. Normal extraocular movements.      Ears:   Left ear canal has multiple vesicular appearing lesions with some hyperemia to the TM without evidence of fluid.      Nose: No congestion/rhinnorhea.   Mouth/Throat: Mucous membranes are moist.  Small canker sore at left inner upper lip   Neck: No stridor. Cardiovascular/Chest: Normal rate, regular rhythm.  No murmurs, rubs, or gallops. Respiratory: Normal respiratory effort without tachypnea nor retractions. Breath sounds are clear and equal bilaterally. No wheezes/rales/rhonchi. Gastrointestinal:  Genitourinary/rectal:Deferred Musculoskeletal:  Neurologic:  Normal speech and language. No gross or focal neurologic deficits are appreciated.  No facial droop. Skin:  Skin is  warm, dry.  Rash to the left face with a lesion at the hairline, the left tragus, and the left cheek. Several of these lesions have a raised red border and dark crusting centrally. The cheek is moderately swollen without induration or fluctuance. Psychiatric: Mood and affect are normal. Speech and behavior are normal. Patient exhibits appropriate insight and judgment.   ____________________________________________  LABS (pertinent positives/negatives)  Labs Reviewed - No data to display  ____________________________________________    EKG I, Lisa Roca, MD, the attending physician have personally viewed and interpreted all ECGs.  None ____________________________________________  RADIOLOGY All Xrays were viewed by me. Imaging interpreted by Radiologist.  None __________________________________________  PROCEDURES  Procedure(s) performed: None  Critical Care performed: None  ____________________________________________   ED COURSE / ASSESSMENT AND PLAN  Pertinent labs & imaging results that were available during my care of the patient were reviewed by me and considered in my medical decision making (see chart for details).   Although one since the cellulitic component looks like it could be improving, when the patient showed me a picture from his phone, it does indeed look like the swelling is worse since I'm concerned it may be the cellulitis is resistant to the Bactrim. I discussed with him switching to clindamycin.  Ultimately however what he describes as the initiating lesion sounds more like a blister or vesicle. When looked in his ear he does have multiple vesicles in the ear canal raising concern that this is actually zoster with potentially arterial superinfection to the initial cheek lesions. Little unlikely with multiple facial nerve roots affected. There is no ocular complaints or evidence on exam. Concern for possible risk for Ramsay Hunt syndrome, but no  facial droop right now.  Given the concern for ongoing knee lesions especially in the ear canal, I did discuss with him starting him on valacyclovir and, despite the fact the initial lesions were a week ago.   CONSULTATIONS:   None   Patient / Family / Caregiver informed of clinical course, medical decision-making process, and agree with plan.   I discussed return precautions, follow-up instructions, and discharge instructions with patient and/or family.   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 Any transcriptional errors that result from this process are unintentional    Lisa Roca, MD 06/14/16 1212

## 2016-06-14 NOTE — ED Triage Notes (Signed)
Rash to left face since Monday.  Rash worsened on Tuesday after Shaving.  Seen by PCP on Friday and given Sulfa.  Area worsening.  Patient has also used hydrogen peroxide and hydrocortisone cream to area.

## 2016-06-14 NOTE — Discharge Instructions (Signed)
You were evaluated for worsening rash, and as we discussed I suspect herpes zoster which is also called shingles. There are some new lesions in the left ear canal, and for this reason I am going to recommend that you take the valacyclovir to help with the virus.  Also the areas on the cheek that are very red I'm concerned you may have resistance of bacterial over infection, and so stop the sulfa antibiotic and start clindamycin.  Please follow up with your primary care doctor in 2-4 days for recheck.  You may be referred to Ear Nose and Throat specialist if not improving.  Return to the emergency department immediately for fever, worsening pain or redness, any visual symptoms including blurry vision or double vision or pain around the eye.

## 2016-06-24 DIAGNOSIS — F411 Generalized anxiety disorder: Secondary | ICD-10-CM | POA: Diagnosis not present

## 2016-06-24 DIAGNOSIS — F331 Major depressive disorder, recurrent, moderate: Secondary | ICD-10-CM | POA: Diagnosis not present

## 2016-08-02 ENCOUNTER — Other Ambulatory Visit: Payer: Self-pay | Admitting: Family Medicine

## 2016-08-02 DIAGNOSIS — E349 Endocrine disorder, unspecified: Secondary | ICD-10-CM

## 2016-09-19 ENCOUNTER — Other Ambulatory Visit: Payer: Self-pay | Admitting: Family Medicine

## 2016-09-19 DIAGNOSIS — I10 Essential (primary) hypertension: Secondary | ICD-10-CM

## 2016-09-23 DIAGNOSIS — F331 Major depressive disorder, recurrent, moderate: Secondary | ICD-10-CM | POA: Diagnosis not present

## 2016-09-23 DIAGNOSIS — F411 Generalized anxiety disorder: Secondary | ICD-10-CM | POA: Diagnosis not present

## 2016-09-29 DIAGNOSIS — G4733 Obstructive sleep apnea (adult) (pediatric): Secondary | ICD-10-CM | POA: Diagnosis not present

## 2016-10-26 DIAGNOSIS — M7542 Impingement syndrome of left shoulder: Secondary | ICD-10-CM | POA: Diagnosis not present

## 2016-10-27 DIAGNOSIS — M25512 Pain in left shoulder: Secondary | ICD-10-CM | POA: Diagnosis not present

## 2016-10-27 DIAGNOSIS — M7542 Impingement syndrome of left shoulder: Secondary | ICD-10-CM | POA: Diagnosis not present

## 2016-11-03 DIAGNOSIS — M25512 Pain in left shoulder: Secondary | ICD-10-CM | POA: Diagnosis not present

## 2016-11-03 DIAGNOSIS — M7542 Impingement syndrome of left shoulder: Secondary | ICD-10-CM | POA: Diagnosis not present

## 2016-11-05 DIAGNOSIS — M25512 Pain in left shoulder: Secondary | ICD-10-CM | POA: Diagnosis not present

## 2016-11-05 DIAGNOSIS — M7542 Impingement syndrome of left shoulder: Secondary | ICD-10-CM | POA: Diagnosis not present

## 2016-11-09 DIAGNOSIS — M7542 Impingement syndrome of left shoulder: Secondary | ICD-10-CM | POA: Diagnosis not present

## 2016-11-09 DIAGNOSIS — M25512 Pain in left shoulder: Secondary | ICD-10-CM | POA: Diagnosis not present

## 2016-11-12 DIAGNOSIS — M25512 Pain in left shoulder: Secondary | ICD-10-CM | POA: Diagnosis not present

## 2016-11-12 DIAGNOSIS — M7542 Impingement syndrome of left shoulder: Secondary | ICD-10-CM | POA: Diagnosis not present

## 2016-11-17 DIAGNOSIS — M7542 Impingement syndrome of left shoulder: Secondary | ICD-10-CM | POA: Diagnosis not present

## 2016-11-17 DIAGNOSIS — M25512 Pain in left shoulder: Secondary | ICD-10-CM | POA: Diagnosis not present

## 2016-11-19 DIAGNOSIS — M7542 Impingement syndrome of left shoulder: Secondary | ICD-10-CM | POA: Diagnosis not present

## 2016-11-19 DIAGNOSIS — M25512 Pain in left shoulder: Secondary | ICD-10-CM | POA: Diagnosis not present

## 2016-11-23 ENCOUNTER — Other Ambulatory Visit: Payer: Self-pay | Admitting: Orthopedic Surgery

## 2016-11-23 DIAGNOSIS — M25512 Pain in left shoulder: Secondary | ICD-10-CM

## 2016-12-10 ENCOUNTER — Ambulatory Visit
Admission: RE | Admit: 2016-12-10 | Discharge: 2016-12-10 | Disposition: A | Discharge status: Home / Self Care | Payer: BLUE CROSS/BLUE SHIELD | Source: Ambulatory Visit | Attending: Orthopedic Surgery | Admitting: Orthopedic Surgery

## 2016-12-10 DIAGNOSIS — M25512 Pain in left shoulder: Secondary | ICD-10-CM | POA: Diagnosis not present

## 2016-12-10 DIAGNOSIS — M7582 Other shoulder lesions, left shoulder: Secondary | ICD-10-CM | POA: Diagnosis not present

## 2016-12-10 MED ORDER — METHYLPREDNISOLONE ACETATE 40 MG/ML INJ SUSP (RADIOLOG
120.0000 mg | Freq: Once | INTRAMUSCULAR | Status: AC
  Administered 2016-12-10: 120 mg via INTRA_ARTICULAR

## 2016-12-10 MED ORDER — IOPAMIDOL (ISOVUE-M 200) INJECTION 41%
20.0000 mL | Freq: Once | INTRAMUSCULAR | Status: AC
  Administered 2016-12-10: 20 mL via INTRA_ARTICULAR

## 2016-12-16 DIAGNOSIS — M25512 Pain in left shoulder: Secondary | ICD-10-CM | POA: Diagnosis not present

## 2016-12-18 ENCOUNTER — Other Ambulatory Visit: Payer: Self-pay | Admitting: Family Medicine

## 2016-12-18 DIAGNOSIS — I10 Essential (primary) hypertension: Secondary | ICD-10-CM

## 2016-12-23 DIAGNOSIS — F331 Major depressive disorder, recurrent, moderate: Secondary | ICD-10-CM | POA: Diagnosis not present

## 2016-12-23 DIAGNOSIS — F411 Generalized anxiety disorder: Secondary | ICD-10-CM | POA: Diagnosis not present

## 2016-12-30 DIAGNOSIS — G4733 Obstructive sleep apnea (adult) (pediatric): Secondary | ICD-10-CM | POA: Diagnosis not present

## 2017-01-15 ENCOUNTER — Encounter: Payer: Self-pay | Admitting: Family Medicine

## 2017-01-15 ENCOUNTER — Ambulatory Visit (INDEPENDENT_AMBULATORY_CARE_PROVIDER_SITE_OTHER): Payer: BLUE CROSS/BLUE SHIELD | Admitting: Family Medicine

## 2017-01-15 VITALS — BP 120/60 | HR 76 | Ht 70.0 in | Wt 266.0 lb

## 2017-01-15 DIAGNOSIS — I10 Essential (primary) hypertension: Secondary | ICD-10-CM | POA: Diagnosis not present

## 2017-01-15 DIAGNOSIS — E349 Endocrine disorder, unspecified: Secondary | ICD-10-CM

## 2017-01-15 MED ORDER — TESTOSTERONE CYPIONATE 200 MG/ML IM SOLN: INTRAMUSCULAR | 2 refills | Status: DC

## 2017-01-15 MED ORDER — LISINOPRIL 10 MG PO TABS
10.0000 mg | ORAL_TABLET | Freq: Every day | ORAL | 3 refills | Status: DC
Start: 2017-01-15 — End: 2017-10-14

## 2017-01-15 NOTE — Progress Notes (Signed)
Name: Steven Carney   MRN: [id number]    DOB: 03-30-1971   Date:01/15/2017       Progress Note  Subjective  Chief Complaint  Chief Complaint  Patient presents with  . Hypertension  . hypotestosterone    has not taken it so he can have labs drawn    Patient presents for HRT maintenance.   Hypertension  This is a new problem. The current episode started more than 1 year ago. The problem is unchanged. The problem is controlled. Pertinent negatives include no anxiety, blurred vision, chest pain, headaches, malaise/fatigue, neck pain, orthopnea, palpitations, peripheral edema, PND, shortness of breath or sweats. There are no associated agents to hypertension. There are no known risk factors for coronary artery disease. Past treatments include ACE inhibitors. The current treatment provides mild improvement. There are no compliance problems.  There is no history of angina, kidney disease, CAD/MI, CVA, heart failure, left ventricular hypertrophy, PVD or retinopathy. There is no history of chronic renal disease, a hypertension causing med or renovascular disease.    No problem-specific Assessment & Plan notes found for this encounter.   Past Medical History:  Diagnosis Date  . Anxiety   . Depression   . Hypertension   . Hypotestosteronism     Past Surgical History:  Procedure Laterality Date  . ANKLE SURGERY     growth removed  . NOSE SURGERY     fracture    Family History  Problem Relation Age of Onset  . Cancer Mother   . Depression Mother   . Diabetes Mother   . Hypertension Mother   . Depression Brother   . Heart disease Father   . Atrial fibrillation Father   . Heart disease Maternal Grandmother   . Hypertension Maternal Grandmother   . Heart disease Paternal Grandmother   . Hypertension Paternal Grandmother     Social History   Social History  . Marital status: Married    Spouse name: N/A  . Number of children: N/A  . Years of education: N/A    Occupational History  . Not on file.   Social History Main Topics  . Smoking status: Former Smoker    Packs/day: 0.50    Types: Cigarettes    Start date: 05/16/1993    Quit date: 05/16/1998  . Smokeless tobacco: Current User    Types: Snuff     Comment: pt doesn't want material- just counseled  . Alcohol use No  . Drug use: No  . Sexual activity: Yes    Birth control/ protection: None   Other Topics Concern  . Not on file   Social History Narrative  . No narrative on file    No Known Allergies  Outpatient Medications Prior to Visit  Medication Sig Dispense Refill  . ABILIFY 5 MG tablet Take 1 tablet by mouth daily at 6 (six) AM. Dr Raelyn Ensign    . buPROPion (WELLBUTRIN SR) 200 MG 12 hr tablet Take 1 tablet by mouth 2 (two) times daily. Transu    . busPIRone (BUSPAR) 15 MG tablet Take 1 tablet by mouth 2 (two) times daily. Transu    . lamoTRIgine (LAMICTAL) 150 MG tablet Take 1 tablet by mouth 2 (two) times daily.    Marland Kitchen PRISTIQ 100 MG 24 hr tablet Take 1 tablet by mouth daily at 6 (six) AM. Dr Raelyn Ensign    . lisinopril (PRINIVIL,ZESTRIL) 10 MG tablet TAKE 1 TABLET DAILY 90 tablet 0  . testosterone cypionate (DEPOTESTOSTERONE CYPIONATE) 200 MG/ML  injection 0.75 cc q wk 10 mL 2  . clindamycin (CLEOCIN) 300 MG capsule Take 1 capsule (300 mg total) by mouth 3 (three) times daily. 30 capsule 0  . lisinopril (PRINIVIL,ZESTRIL) 10 MG tablet TAKE 1 TABLET DAILY PLEASE MAKE AN APPOINTMENT WITH   YOUR DOCTOR 90 tablet 0  . mupirocin cream (BACTROBAN) 2 % Apply 1 application topically 2 (two) times daily. 30 g 1  . sulfamethoxazole-trimethoprim (BACTRIM DS,SEPTRA DS) 800-160 MG tablet Take 1 tablet by mouth 2 (two) times daily. 20 tablet 0   No facility-administered medications prior to visit.     Review of Systems  Constitutional: Negative for chills, fever, malaise/fatigue and weight loss.  HENT: Negative for ear discharge, ear pain and sore throat.   Eyes: Negative for blurred vision.   Respiratory: Negative for cough, sputum production, shortness of breath and wheezing.   Cardiovascular: Negative for chest pain, palpitations, orthopnea, leg swelling and PND.  Gastrointestinal: Negative for abdominal pain, blood in stool, constipation, diarrhea, heartburn, melena and nausea.  Genitourinary: Negative for dysuria, frequency, hematuria and urgency.  Musculoskeletal: Negative for back pain, joint pain, myalgias and neck pain.  Skin: Negative for rash.  Neurological: Negative for dizziness, tingling, sensory change, focal weakness and headaches.  Endo/Heme/Allergies: Negative for environmental allergies and polydipsia. Does not bruise/bleed easily.  Psychiatric/Behavioral: Negative for depression and suicidal ideas. The patient is not nervous/anxious and does not have insomnia.      Objective  Vitals:   01/15/17 0826  BP: 120/60  Pulse: 76  Weight: 266 lb (120.7 kg)  Height: 5\' 10"  (1.778 m)    Physical Exam  Constitutional: He is oriented to person, place, and time and well-developed, well-nourished, and in no distress.  HENT:  Head: Normocephalic.  Right Ear: External ear normal.  Left Ear: External ear normal.  Nose: Nose normal.  Mouth/Throat: Oropharynx is clear and moist.  Eyes: Conjunctivae and EOM are normal. Pupils are equal, round, and reactive to light. Right eye exhibits no discharge. Left eye exhibits no discharge. No scleral icterus.  Neck: Normal range of motion. Neck supple. No JVD present. No tracheal deviation present. No thyromegaly present.  Cardiovascular: Normal rate, regular rhythm, normal heart sounds and intact distal pulses.  Exam reveals no gallop and no friction rub.   No murmur heard. Pulmonary/Chest: Breath sounds normal. No respiratory distress. He has no wheezes. He has no rales.  Abdominal: Soft. Bowel sounds are normal. He exhibits no mass. There is no hepatosplenomegaly. There is no tenderness. There is no rebound, no guarding and  no CVA tenderness.  Musculoskeletal: Normal range of motion. He exhibits no edema or tenderness.  Lymphadenopathy:    He has no cervical adenopathy.  Neurological: He is alert and oriented to person, place, and time. He has normal sensation, normal strength and intact cranial nerves. No cranial nerve deficit.  Skin: Skin is warm. No rash noted.  Psychiatric: Mood and affect normal.  Nursing note and vitals reviewed.     Assessment & Plan  Problem List Items Addressed This Visit      Cardiovascular and Mediastinum   Essential hypertension - Primary   Relevant Medications   lisinopril (PRINIVIL,ZESTRIL) 10 MG tablet   Other Relevant Orders   Renal Function Panel     Other   Hypotestosteronemia   Relevant Medications   testosterone cypionate (DEPOTESTOSTERONE CYPIONATE) 200 MG/ML injection   Other Relevant Orders   Testosterone,Free and Total      Meds ordered this encounter  Medications  . testosterone cypionate (DEPOTESTOSTERONE CYPIONATE) 200 MG/ML injection    Sig: 0.75 cc q wk    Dispense:  10 mL    Refill:  2  . lisinopril (PRINIVIL,ZESTRIL) 10 MG tablet    Sig: Take 1 tablet (10 mg total) by mouth daily.    Dispense:  90 tablet    Refill:  3    sched appt      Dr. Otilio Miu [facility] [place] [facility]  01/15/17

## 2017-01-16 LAB — TESTOSTERONE,FREE AND TOTAL
Testosterone, Free: 16.8 pg/mL (ref 6.8–21.5)
Testosterone: 313 ng/dL (ref 264–916)

## 2017-01-16 LAB — RENAL FUNCTION PANEL
Albumin: 4.8 g/dL (ref 3.5–5.5)
BUN / CREAT RATIO: 10 (ref 9–20)
BUN: 15 mg/dL (ref 6–24)
CO2: 24 mmol/L (ref 18–29)
CREATININE: 1.5 mg/dL — AB (ref 0.76–1.27)
Calcium: 10.1 mg/dL (ref 8.7–10.2)
Chloride: 98 mmol/L (ref 96–106)
GFR calc Af Amer: 65 mL/min/{1.73_m2} (ref >59)
GFR calc non Af Amer: 56 mL/min/{1.73_m2} — ABNORMAL LOW (ref >59)
Glucose: 81 mg/dL (ref 65–99)
Phosphorus: 2.6 mg/dL (ref 2.5–4.5)
Potassium: 4.9 mmol/L (ref 3.5–5.2)
SODIUM: 139 mmol/L (ref 134–144)

## 2017-03-15 ENCOUNTER — Other Ambulatory Visit: Payer: Self-pay | Admitting: Family Medicine

## 2017-03-15 DIAGNOSIS — I10 Essential (primary) hypertension: Secondary | ICD-10-CM

## 2017-03-24 DIAGNOSIS — F331 Major depressive disorder, recurrent, moderate: Secondary | ICD-10-CM | POA: Diagnosis not present

## 2017-03-24 DIAGNOSIS — F411 Generalized anxiety disorder: Secondary | ICD-10-CM | POA: Diagnosis not present

## 2017-04-06 ENCOUNTER — Other Ambulatory Visit: Payer: Self-pay

## 2017-04-14 DIAGNOSIS — G4733 Obstructive sleep apnea (adult) (pediatric): Secondary | ICD-10-CM | POA: Diagnosis not present

## 2017-05-03 ENCOUNTER — Other Ambulatory Visit: Payer: Self-pay

## 2017-06-16 ENCOUNTER — Other Ambulatory Visit: Payer: Self-pay | Admitting: Family Medicine

## 2017-06-16 DIAGNOSIS — I10 Essential (primary) hypertension: Secondary | ICD-10-CM

## 2017-07-07 DIAGNOSIS — F411 Generalized anxiety disorder: Secondary | ICD-10-CM | POA: Diagnosis not present

## 2017-07-07 DIAGNOSIS — F331 Major depressive disorder, recurrent, moderate: Secondary | ICD-10-CM | POA: Diagnosis not present

## 2017-07-13 IMAGING — XA DG FLUORO GUIDE NDL PLC/BX
3 series · 3 of 3 positions shown · non-contrast
Comparison: none

CLINICAL DATA: Pain and limited range of motion.

[Series 1: ortho standard · 1 of 1 slices shown (1 of 3)]
[im 1/1]
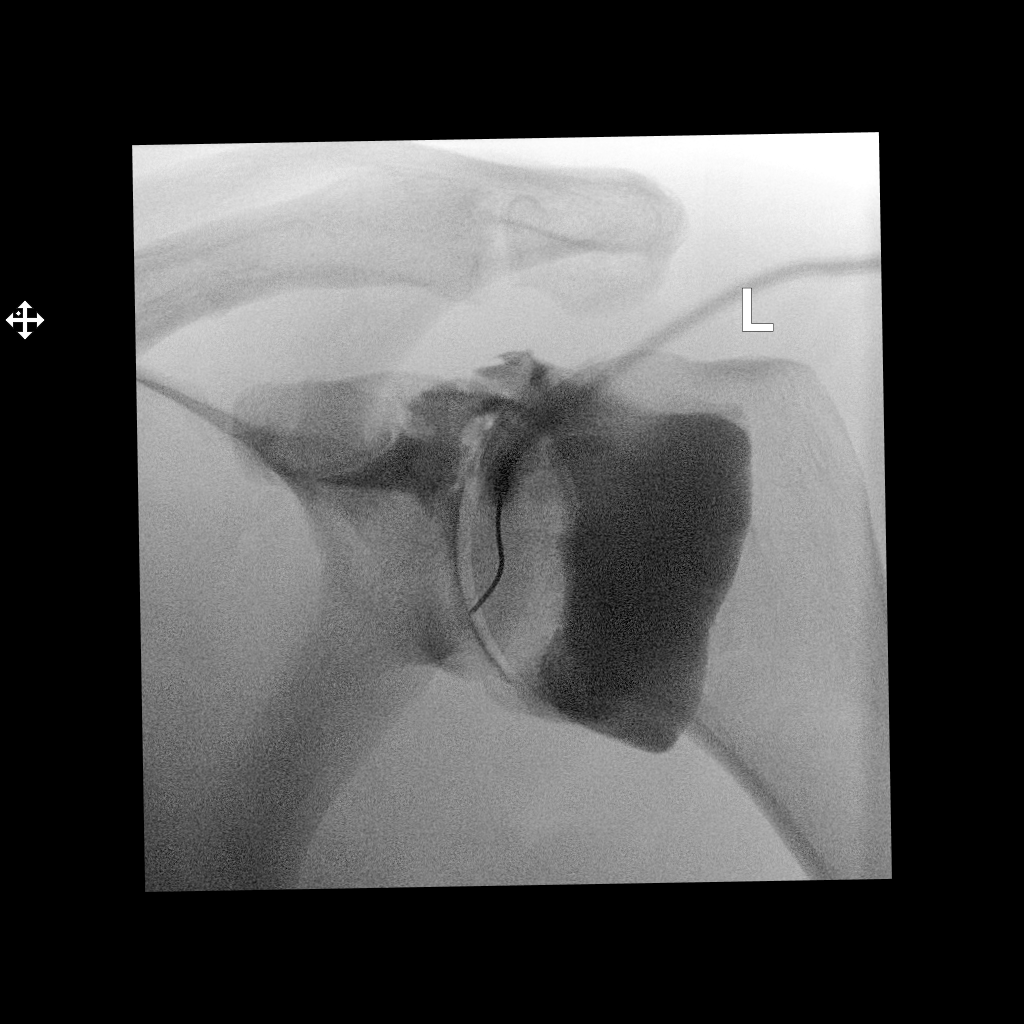

[Series 2: ortho standard · 1 of 1 slices shown (2 of 3)]
[im 1/1]
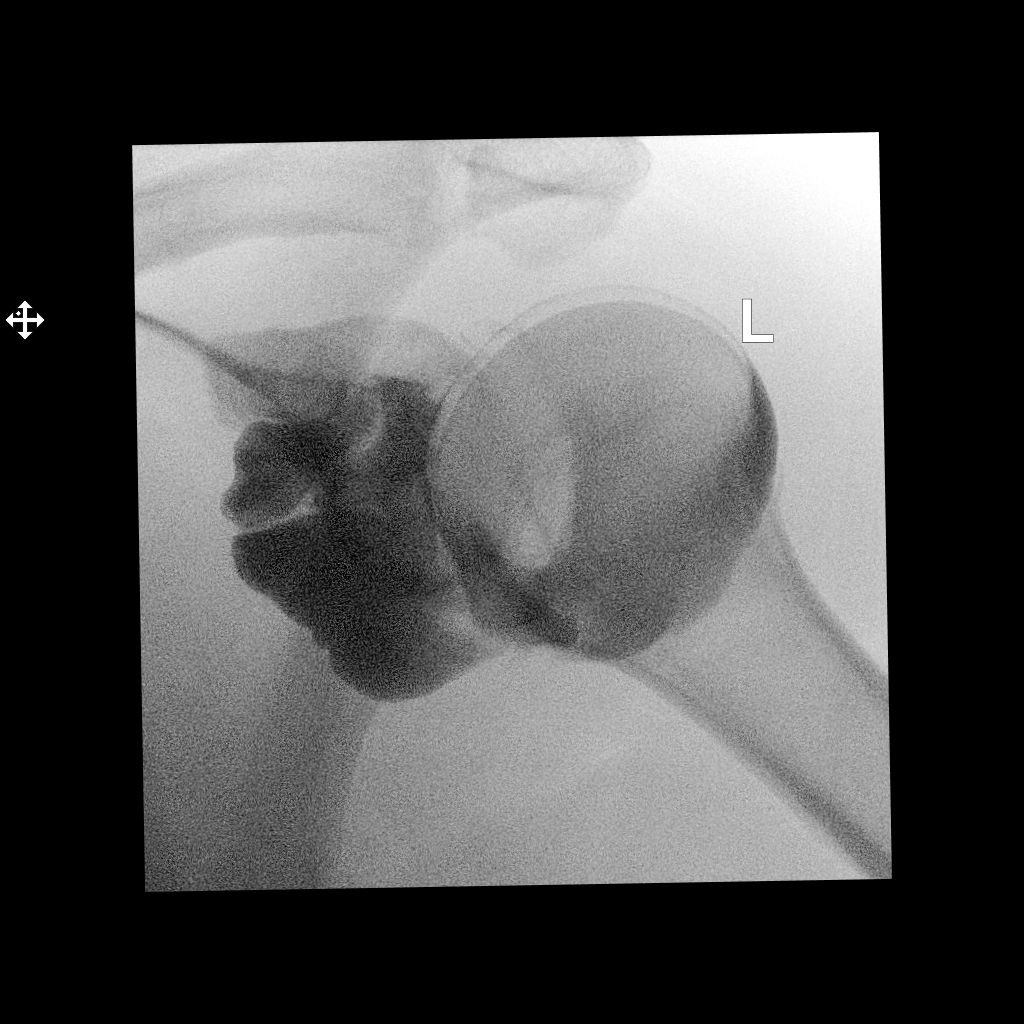

[Series 3: ortho standard · 1 of 1 slices shown (3 of 3)]
[im 1/1]
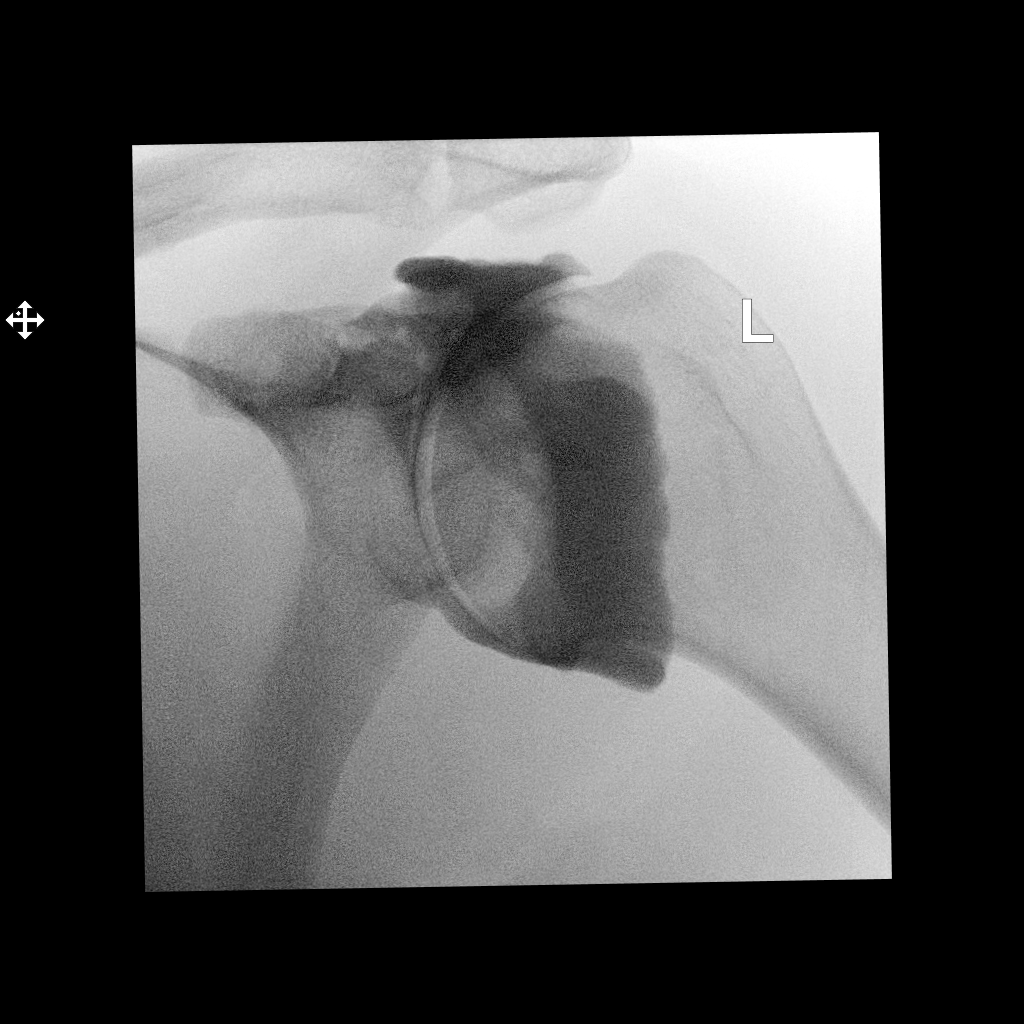

[3 of 3 positions shown; findings below may reference images not displayed]

FLUOROSCOPY TIME:  0 minutes 30 seconds. 32.37 micro gray meter
squared.

PROCEDURE:
Left SHOULDER INJECTION UNDER FLUOROSCOPY

The skin overlying the shoulder was scrubbed with Betadine and
draped in sterile fashion. Skin and subcutaneous anesthesia was
carried out using a 25 gauge needle and 1% lidocaine. A 22 gauge
spinal needle was directed under fluoroscopic guidance on one pass
into the glenohumeral joint. 20 cc of a mixture of 0.1 cc MultiHance
and dilute Isovue 200 was then used to fill the glenohumeral joint.

Additionally, as requested, 120 mg of Depo-Medrol added to the
injectate.

Limited filming does not show any under surface cuff abnormality.
IMPRESSION: Technically successful left shoulder injection for MRI.

120 mg Depo-Medrol added at the request of the ordering physician.

## 2017-07-13 IMAGING — MR MR SHOULDER*L* W/ CM
6 series · 40 of 40 positions shown · IV contrast (agent unspecified)
Comparison: None.

CLINICAL DATA: Left shoulder pain for 2 months which is worse with
lifting. No known injury.

EXAM:
MR ARTHROGRAM OF THE LEFT SHOULDER
TECHNIQUE: Multiplanar, multisequence MR imaging of the left shoulder was
performed following the administration of intra-articular contrast.
CONTRAST:  See Injection Documentation.

[Series 3: T1 fat-sat · axial · 4.0mm · 0.23mm/px · z∈[-73,+11]mm · 6 of 20 slices shown (1 of 4)]
[im 1/20]
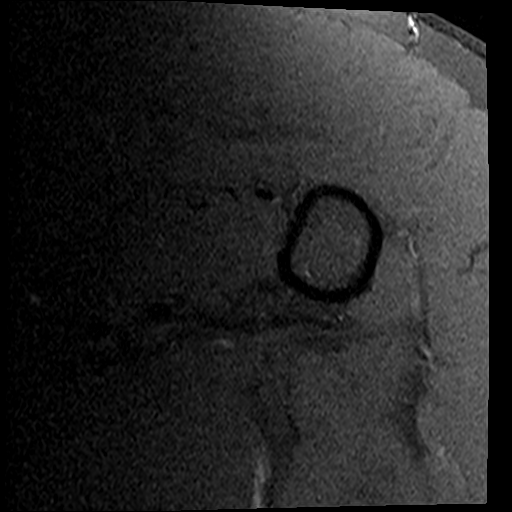
[im 4/20]
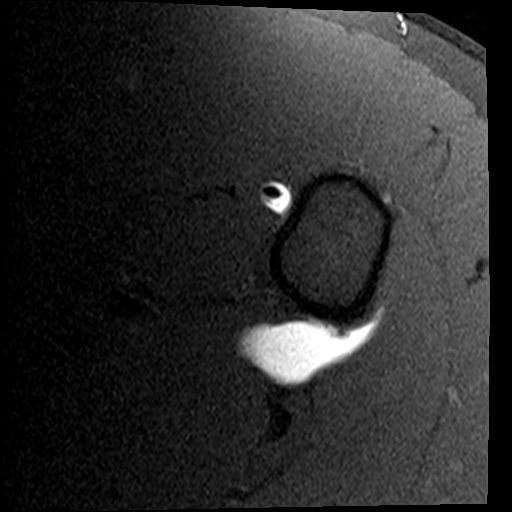
[im 8/20]
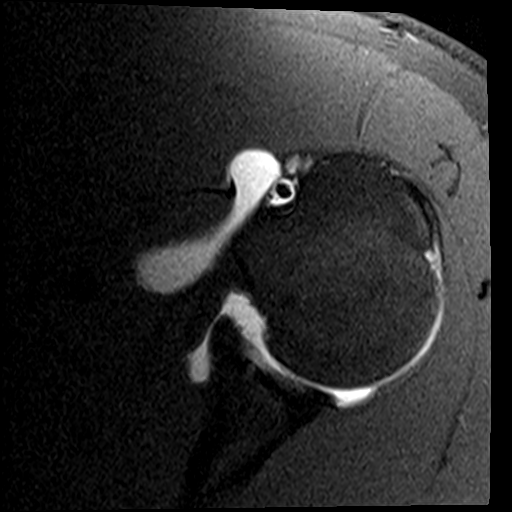
[im 12/20]
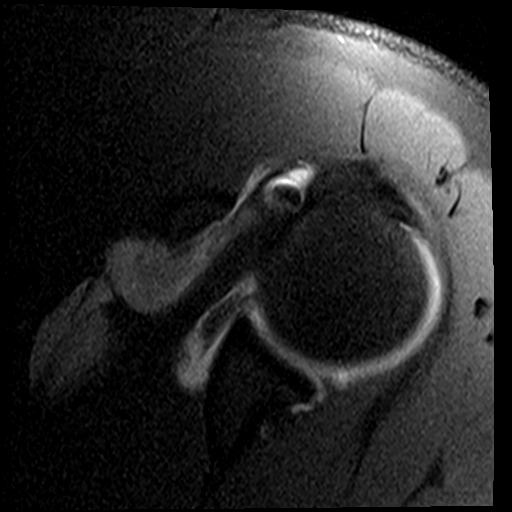
[im 16/20]
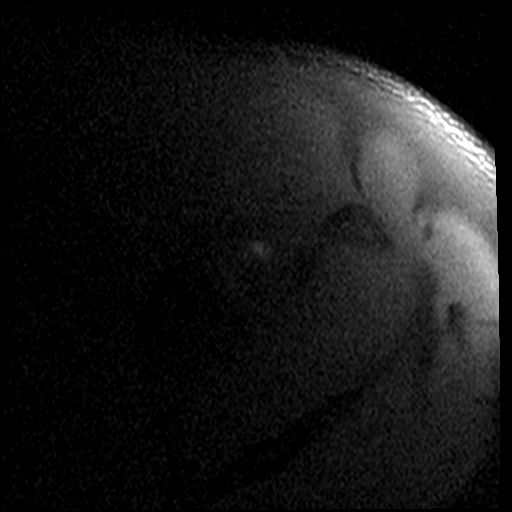
[im 20/20]
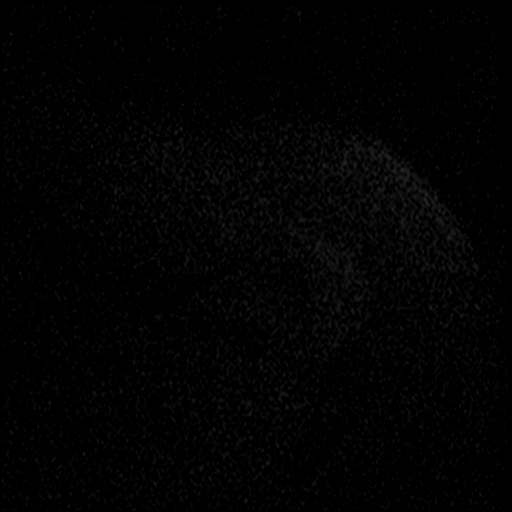

[Series 4: T2 fat-sat · coronal · 4.0mm · 0.55mm/px · 7 of 19 slices shown (1 of 2)]
[im 1/19]
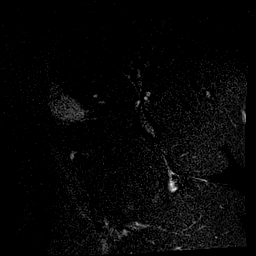
[im 4/19]
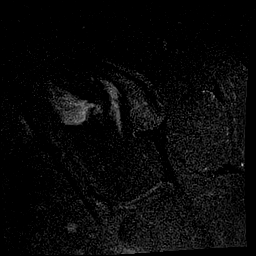
[im 7/19]
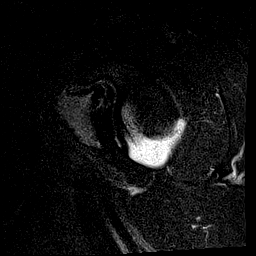
[im 10/19]
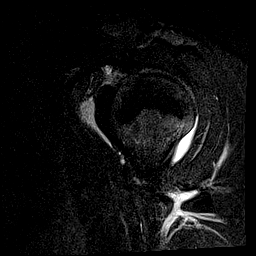
[im 13/19]
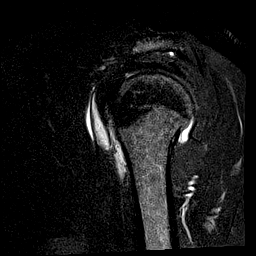
[im 16/19]
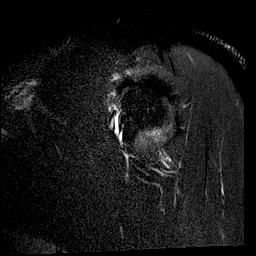
[im 19/19]
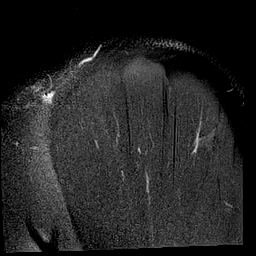

[Series 5: T1 fat-sat · oblique · 4.0mm · 0.55mm/px · 7 of 19 slices shown (2 of 4)]
[im 1/19]
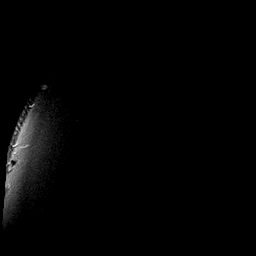
[im 4/19]
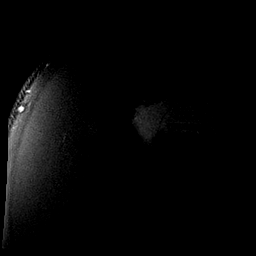
[im 7/19]
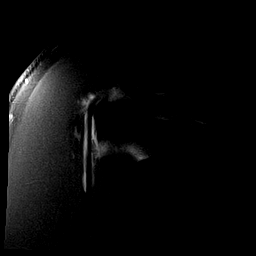
[im 10/19]
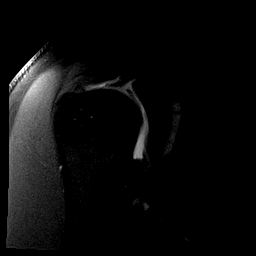
[im 13/19]
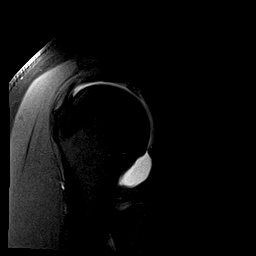
[im 16/19]
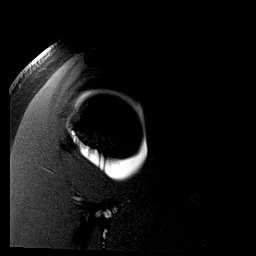
[im 19/19]
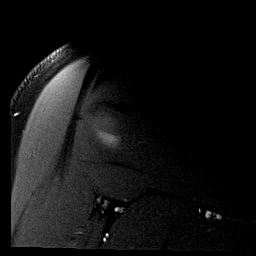

[Series 6: T1 fat-sat · oblique · 4.0mm · 0.44mm/px · 7 of 19 slices shown (3 of 4)]
[im 1/19]
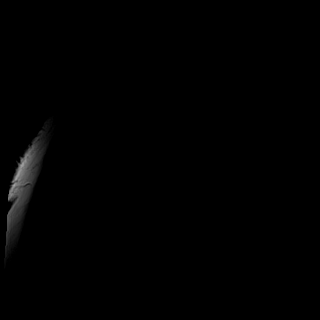
[im 4/19]
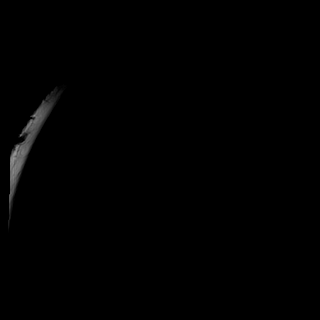
[im 7/19]
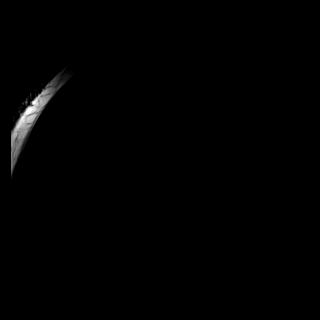
[im 10/19]
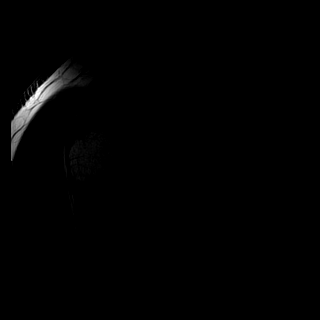
[im 13/19]
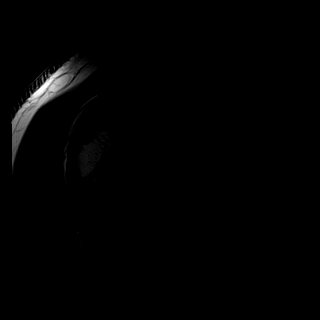
[im 16/19]
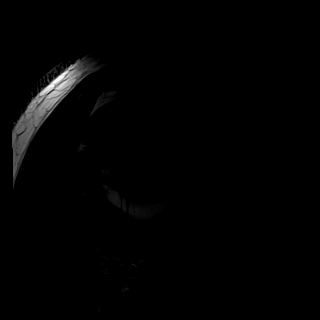
[im 19/19]
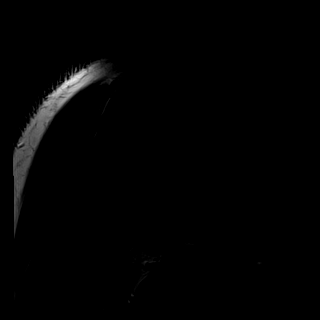

[Series 7: T2 fat-sat · oblique · 4.0mm · 0.55mm/px · 7 of 19 slices shown (2 of 2)]
[im 1/19]
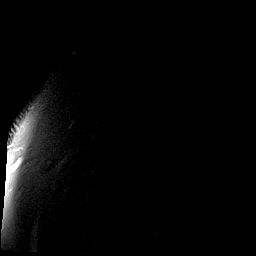
[im 4/19]
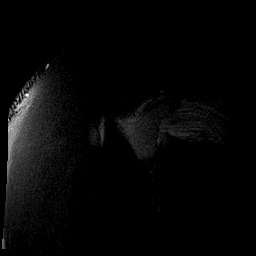
[im 7/19]
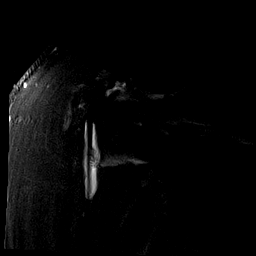
[im 10/19]
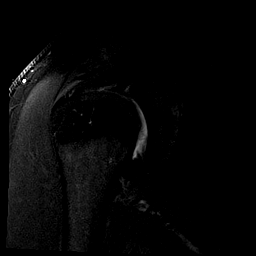
[im 13/19]
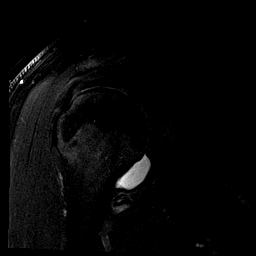
[im 16/19]
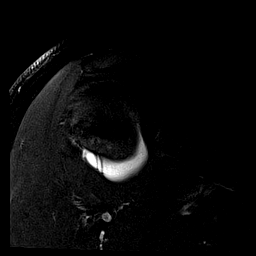
[im 19/19]
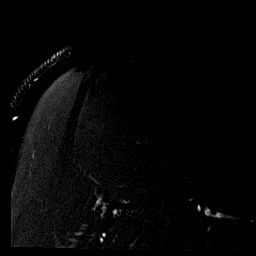

[Series 10: T1 fat-sat · sagittal · 4.0mm · 0.59mm/px · 6 of 18 slices shown (4 of 4)]
[im 1/18]
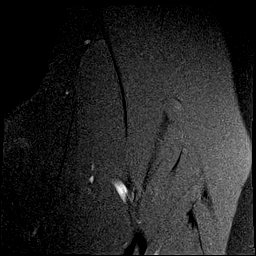
[im 4/18]
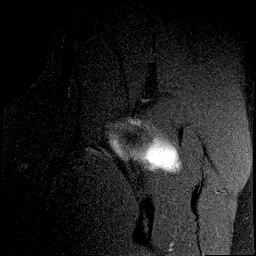
[im 7/18]
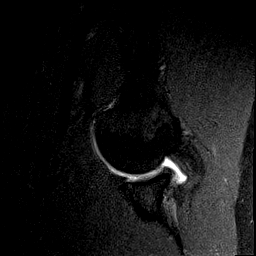
[im 11/18]
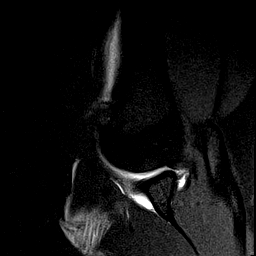
[im 14/18]
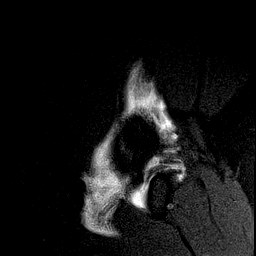
[im 18/18]
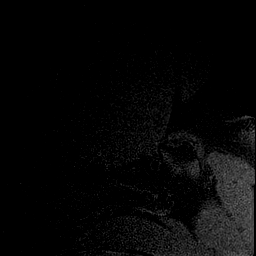

[40 of 40 positions shown; findings below may reference images not displayed]

FINDINGS: Rotator cuff: There is some thickening and heterogeneously increased
T2 signal in the supraspinatus and infraspinatus tendons consistent
with tendinopathy. No tear. The rotator cuff is otherwise
unremarkable.

Muscles: Normal in appearance without atrophy or focal lesion.

Biceps long head: The biceps tendon is partially torn from the
superior labrum.

Acromioclavicular Joint: Moderate to moderately severe degenerative
change appears advanced for age.

Glenohumeral Joint: Negative.

Labrum: The patient has a superior to posterior labral tear. The
tear extends from approximately the 1 o'clock position of the
anterior, superior labrum across the superior labrum and into the
posterior labrum to the 9 o'clock position. The anterior, inferior
labrum is frayed as best seen on the ABER imaging.

Bones: No fracture or worrisome lesion. The acromion is type 1.
There is no contrast or fluid in the subacromial/subdeltoid bursa.
IMPRESSION: Tear of the labrum from approximately the 1 o'clock position
anterosuperiorly across the superior labrum and down the posterior
labrum to the 9 o'clock position. There is partial tearing of the
biceps tendon from its attachment to the superior labrum. The
anterior, inferior labrum appears frayed without definite tear.

Supraspinatus and infraspinatus tendinopathy without tear.

Moderate to moderately severe acromioclavicular osteoarthritis
appears advanced for age.

## 2017-07-21 DIAGNOSIS — F411 Generalized anxiety disorder: Secondary | ICD-10-CM | POA: Diagnosis not present

## 2017-07-21 DIAGNOSIS — F331 Major depressive disorder, recurrent, moderate: Secondary | ICD-10-CM | POA: Diagnosis not present

## 2017-07-24 ENCOUNTER — Other Ambulatory Visit: Payer: Self-pay | Admitting: Family Medicine

## 2017-07-24 DIAGNOSIS — E349 Endocrine disorder, unspecified: Secondary | ICD-10-CM

## 2017-08-06 DIAGNOSIS — G4733 Obstructive sleep apnea (adult) (pediatric): Secondary | ICD-10-CM | POA: Diagnosis not present

## 2017-08-23 ENCOUNTER — Other Ambulatory Visit: Payer: Self-pay

## 2017-09-05 ENCOUNTER — Other Ambulatory Visit: Payer: Self-pay | Admitting: Family Medicine

## 2017-09-05 DIAGNOSIS — I10 Essential (primary) hypertension: Secondary | ICD-10-CM

## 2017-09-09 DIAGNOSIS — T887XXA Unspecified adverse effect of drug or medicament, initial encounter: Secondary | ICD-10-CM | POA: Diagnosis not present

## 2017-09-09 DIAGNOSIS — F411 Generalized anxiety disorder: Secondary | ICD-10-CM | POA: Diagnosis not present

## 2017-09-09 DIAGNOSIS — F331 Major depressive disorder, recurrent, moderate: Secondary | ICD-10-CM | POA: Diagnosis not present

## 2017-09-24 DIAGNOSIS — F331 Major depressive disorder, recurrent, moderate: Secondary | ICD-10-CM | POA: Diagnosis not present

## 2017-09-24 DIAGNOSIS — F411 Generalized anxiety disorder: Secondary | ICD-10-CM | POA: Diagnosis not present

## 2017-09-26 ENCOUNTER — Other Ambulatory Visit: Payer: Self-pay | Admitting: Family Medicine

## 2017-09-26 DIAGNOSIS — E349 Endocrine disorder, unspecified: Secondary | ICD-10-CM

## 2017-10-06 ENCOUNTER — Other Ambulatory Visit: Payer: Self-pay

## 2017-10-14 ENCOUNTER — Ambulatory Visit: Payer: BLUE CROSS/BLUE SHIELD | Admitting: Family Medicine

## 2017-10-14 ENCOUNTER — Encounter: Payer: Self-pay | Admitting: Family Medicine

## 2017-10-14 VITALS — BP 120/80 | HR 80 | Ht 70.0 in | Wt 266.0 lb

## 2017-10-14 DIAGNOSIS — D179 Benign lipomatous neoplasm, unspecified: Secondary | ICD-10-CM

## 2017-10-14 DIAGNOSIS — F331 Major depressive disorder, recurrent, moderate: Secondary | ICD-10-CM

## 2017-10-14 DIAGNOSIS — T887XXA Unspecified adverse effect of drug or medicament, initial encounter: Secondary | ICD-10-CM | POA: Diagnosis not present

## 2017-10-14 DIAGNOSIS — I10 Essential (primary) hypertension: Secondary | ICD-10-CM | POA: Diagnosis not present

## 2017-10-14 DIAGNOSIS — F411 Generalized anxiety disorder: Secondary | ICD-10-CM | POA: Diagnosis not present

## 2017-10-14 DIAGNOSIS — E349 Endocrine disorder, unspecified: Secondary | ICD-10-CM

## 2017-10-14 MED ORDER — TESTOSTERONE CYPIONATE 200 MG/ML IM SOLN: INTRAMUSCULAR | 2 refills | Status: DC

## 2017-10-14 MED ORDER — LISINOPRIL 10 MG PO TABS: 10.0000 mg | ORAL_TABLET | Freq: Every day | ORAL | 3 refills | Status: DC

## 2017-10-14 NOTE — Progress Notes (Signed)
Name: Steven Carney   MRN: [id number]    DOB: April 08, 1971   Date:10/14/2017       Progress Note  Subjective  Chief Complaint  Chief Complaint  Patient presents with  . Hypertension  . hypotestosteronism    Patient presents for    Hypertension  This is a chronic problem. The current episode started more than 1 year ago. The problem is unchanged. The problem is controlled. Associated symptoms include headaches. Pertinent negatives include no anxiety, blurred vision, chest pain, malaise/fatigue, neck pain, orthopnea, palpitations, peripheral edema, PND, shortness of breath or sweats. There are no associated agents to hypertension. Risk factors for coronary artery disease include dyslipidemia and diabetes mellitus. Past treatments include ACE inhibitors. The current treatment provides moderate improvement. There are no compliance problems.  There is no history of angina, kidney disease, CAD/MI, CVA, heart failure, left ventricular hypertrophy, PVD or retinopathy. There is no history of chronic renal disease, a hypertension causing med or renovascular disease.    No problem-specific Assessment & Plan notes found for this encounter.   Past Medical History:  Diagnosis Date  . Anxiety   . Depression   . Hypertension   . Hypotestosteronism     Past Surgical History:  Procedure Laterality Date  . ANKLE SURGERY     growth removed  . NOSE SURGERY     fracture    Family History  Problem Relation Age of Onset  . Cancer Mother   . Depression Mother   . Diabetes Mother   . Hypertension Mother   . Depression Brother   . Heart disease Father   . Atrial fibrillation Father   . Heart disease Maternal Grandmother   . Hypertension Maternal Grandmother   . Heart disease Paternal Grandmother   . Hypertension Paternal Grandmother     Social History   Socioeconomic History  . Marital status: Married    Spouse name: Not on file  . Number of children: Not on file  . Years of  education: Not on file  . Highest education level: Not on file  Social Needs  . Financial resource strain: Not on file  . Food insecurity - worry: Not on file  . Food insecurity - inability: Not on file  . Transportation needs - medical: Not on file  . Transportation needs - non-medical: Not on file  Occupational History  . Not on file  Tobacco Use  . Smoking status: Former Smoker    Packs/day: 0.50    Types: Cigarettes    Start date: 05/16/1993    Last attempt to quit: 05/16/1998    Years since quitting: 19.4  . Smokeless tobacco: Current User    Types: Snuff  . Tobacco comment: pt doesn't want material- just counseled  Substance and Sexual Activity  . Alcohol use: No    Alcohol/week: 0.0 oz  . Drug use: No  . Sexual activity: Yes    Birth control/protection: None  Other Topics Concern  . Not on file  Social History Narrative  . Not on file    No Known Allergies  Outpatient Medications Prior to Visit  Medication Sig Dispense Refill  . buPROPion (WELLBUTRIN SR) 200 MG 12 hr tablet Take 1 tablet by mouth 2 (two) times daily. Transu    . busPIRone (BUSPAR) 15 MG tablet Take 1 tablet by mouth 2 (two) times daily. Transu    . lamoTRIgine (LAMICTAL) 150 MG tablet Take 1 tablet by mouth 2 (two) times daily.    Marland Kitchen  PRISTIQ 50 MG 24 hr tablet Take 1 tablet by mouth daily at 6 (six) AM. Dr Raelyn Ensign    . VRAYLAR capsule Take 1 capsule by mouth daily.  0  . ABILIFY 5 MG tablet Take 1 tablet by mouth daily at 6 (six) AM. Dr Raelyn Ensign    . lisinopril (PRINIVIL,ZESTRIL) 10 MG tablet Take 1 tablet (10 mg total) by mouth daily. 90 tablet 3  . testosterone cypionate (DEPOTESTOSTERONE CYPIONATE) 200 MG/ML injection 0.75 cc q wk 10 mL 2  . lisinopril (PRINIVIL,ZESTRIL) 10 MG tablet TAKE 1 TABLET DAILY PLEASE MAKE AN APPOINTMENT WITH   YOUR DOCTOR 90 tablet 0  . lisinopril (PRINIVIL,ZESTRIL) 10 MG tablet TAKE 1 TABLET DAILY PLEASE MAKE AN APPOINTMENT WITH   YOUR DOCTOR FORDECEMBER 30 tablet 0   No  facility-administered medications prior to visit.     Review of Systems  Constitutional: Negative for chills, fever, malaise/fatigue and weight loss.  HENT: Negative for ear discharge, ear pain and sore throat.   Eyes: Negative for blurred vision.  Respiratory: Negative for cough, sputum production, shortness of breath and wheezing.   Cardiovascular: Negative for chest pain, palpitations, orthopnea, leg swelling and PND.  Gastrointestinal: Negative for abdominal pain, blood in stool, constipation, diarrhea, heartburn, melena and nausea.  Genitourinary: Negative for dysuria, frequency, hematuria and urgency.  Musculoskeletal: Positive for joint pain. Negative for back pain, myalgias and neck pain.  Skin: Negative for rash.  Neurological: Positive for headaches. Negative for dizziness, tingling, sensory change and focal weakness.  Endo/Heme/Allergies: Negative for environmental allergies and polydipsia. Does not bruise/bleed easily.  Psychiatric/Behavioral: Negative for depression and suicidal ideas. The patient is not nervous/anxious and does not have insomnia.      Objective  Vitals:   10/14/17 1114  BP: 120/80  Pulse: 80  Weight: 266 lb (120.7 kg)  Height: 5\' 10"  (1.778 m)    Physical Exam  Constitutional: He is oriented to person, place, and time and well-developed, well-nourished, and in no distress.  HENT:  Head: Normocephalic.  Right Ear: External ear normal.  Left Ear: External ear normal.  Nose: Nose normal.  Mouth/Throat: Oropharynx is clear and moist.  Eyes: Conjunctivae and EOM are normal. Pupils are equal, round, and reactive to light. Right eye exhibits no discharge. Left eye exhibits no discharge. No scleral icterus.  Neck: Normal range of motion. Neck supple. No JVD present. No tracheal deviation present. No thyromegaly present.  Cardiovascular: Normal rate, regular rhythm, normal heart sounds and intact distal pulses. Exam reveals no gallop and no friction rub.   No murmur heard. Pulmonary/Chest: Breath sounds normal. No respiratory distress. He has no wheezes. He has no rales.  Abdominal: Soft. Bowel sounds are normal. He exhibits no mass. There is no hepatosplenomegaly. There is no tenderness. There is no rebound, no guarding and no CVA tenderness.  Genitourinary: Rectum normal and prostate normal. Rectal exam shows guaiac negative stool.  Musculoskeletal: Normal range of motion. He exhibits no edema or tenderness.  Lymphadenopathy:    He has no cervical adenopathy.  Neurological: He is alert and oriented to person, place, and time. He has normal sensation, normal strength, normal reflexes and intact cranial nerves. No cranial nerve deficit.  Skin: Skin is warm. No rash noted.  Palpable lipomas  Psychiatric: Mood and affect normal.  Nursing note and vitals reviewed.     Assessment & Plan  Problem List Items Addressed This Visit      Cardiovascular and Mediastinum   Essential hypertension - Primary  Relevant Medications   lisinopril (PRINIVIL,ZESTRIL) 10 MG tablet   Other Relevant Orders   Renal Function Panel     Other   Hypotestosteronemia   Relevant Medications   testosterone cypionate (DEPOTESTOSTERONE CYPIONATE) 200 MG/ML injection   Major depressive disorder, recurrent episode, moderate ([place])    Other Visit Diagnoses    Testosterone deficiency       Lipoma, unspecified site       multiple area      Meds ordered this encounter  Medications  . testosterone cypionate (DEPOTESTOSTERONE CYPIONATE) 200 MG/ML injection    Sig: 0.75 cc q wk    Dispense:  10 mL    Refill:  2  . lisinopril (PRINIVIL,ZESTRIL) 10 MG tablet    Sig: Take 1 tablet (10 mg total) by mouth daily.    Dispense:  90 tablet    Refill:  3    sched appt      Dr. Otilio Miu [facility] [place]  10/14/17

## 2017-10-14 NOTE — Patient Instructions (Signed)
Lipoma A lipoma is a noncancerous (benign) tumor that is made up of fat cells. This is a very common type of soft-tissue growth. Lipomas are usually found under the skin (subcutaneous). They may occur in any tissue of the body that contains fat. Common areas for lipomas to appear include the back, shoulders, buttocks, and thighs. Lipomas grow slowly, and they are usually painless. Most lipomas do not cause problems and do not require treatment. What are the causes? The cause of this condition is not known. What increases the risk? This condition is more likely to develop in:  People who are 40-60 years old.  People who have a family history of lipomas.  What are the signs or symptoms? A lipoma usually appears as a small, round bump under the skin. It may feel soft or rubbery, but the firmness can vary. Most lipomas are not painful. However, a lipoma may become painful if it is located in an area where it pushes on nerves. How is this diagnosed? A lipoma can usually be diagnosed with a physical exam. You may also have tests to confirm the diagnosis and to rule out other conditions. Tests may include:  Imaging tests, such as a CT scan or MRI.  Removal of a tissue sample to be looked at under a microscope (biopsy).  How is this treated? Treatment is not needed for small lipomas that are not causing problems. If a lipoma continues to get bigger or it causes problems, removal is often the best option. Lipomas can also be removed to improve appearance. Removal of a lipoma is usually done with a surgery in which the fatty cells and the surrounding capsule are removed. Most often, a medicine that numbs the area (local anesthetic) is used for this procedure. Follow these instructions at home:  Keep all follow-up visits as directed by your health care provider. This is important. Contact a health care provider if:  Your lipoma becomes larger or hard.  Your lipoma becomes painful, red, or  increasingly swollen. These could be signs of infection or a more serious condition. This information is not intended to replace advice given to you by your health care provider. Make sure you discuss any questions you have with your health care provider. Document Released: 07/24/2002 Document Revised: 01/09/2016 Document Reviewed: 07/30/2014 [employer] Interactive Patient Education  2018 [employer]  

## 2017-10-15 LAB — RENAL FUNCTION PANEL
ALBUMIN: 5.2 g/dL (ref 3.5–5.5)
BUN/Creatinine Ratio: 11 (ref 9–20)
BUN: 13 mg/dL (ref 6–24)
CO2: 24 mmol/L (ref 20–29)
CREATININE: 1.21 mg/dL (ref 0.76–1.27)
Calcium: 10 mg/dL (ref 8.7–10.2)
Chloride: 97 mmol/L (ref 96–106)
GFR calc Af Amer: 84 mL/min/{1.73_m2} (ref >59)
GFR, EST NON AFRICAN AMERICAN: 72 mL/min/{1.73_m2} (ref >59)
Glucose: 79 mg/dL (ref 65–99)
PHOSPHORUS: 3.8 mg/dL (ref 2.5–4.5)
Potassium: 4.6 mmol/L (ref 3.5–5.2)
Sodium: 140 mmol/L (ref 134–144)

## 2017-10-22 DIAGNOSIS — F411 Generalized anxiety disorder: Secondary | ICD-10-CM | POA: Diagnosis not present

## 2017-10-22 DIAGNOSIS — F331 Major depressive disorder, recurrent, moderate: Secondary | ICD-10-CM | POA: Diagnosis not present

## 2017-11-24 DIAGNOSIS — F331 Major depressive disorder, recurrent, moderate: Secondary | ICD-10-CM | POA: Diagnosis not present

## 2017-11-24 DIAGNOSIS — T887XXA Unspecified adverse effect of drug or medicament, initial encounter: Secondary | ICD-10-CM | POA: Diagnosis not present

## 2017-11-24 DIAGNOSIS — F411 Generalized anxiety disorder: Secondary | ICD-10-CM | POA: Diagnosis not present

## 2017-12-28 DIAGNOSIS — T887XXA Unspecified adverse effect of drug or medicament, initial encounter: Secondary | ICD-10-CM | POA: Diagnosis not present

## 2017-12-28 DIAGNOSIS — F331 Major depressive disorder, recurrent, moderate: Secondary | ICD-10-CM | POA: Diagnosis not present

## 2017-12-28 DIAGNOSIS — F411 Generalized anxiety disorder: Secondary | ICD-10-CM | POA: Diagnosis not present

## 2018-01-04 DIAGNOSIS — G4733 Obstructive sleep apnea (adult) (pediatric): Secondary | ICD-10-CM | POA: Diagnosis not present

## 2018-01-25 DIAGNOSIS — F331 Major depressive disorder, recurrent, moderate: Secondary | ICD-10-CM | POA: Diagnosis not present

## 2018-01-25 DIAGNOSIS — F411 Generalized anxiety disorder: Secondary | ICD-10-CM | POA: Diagnosis not present

## 2018-01-25 DIAGNOSIS — T887XXA Unspecified adverse effect of drug or medicament, initial encounter: Secondary | ICD-10-CM | POA: Diagnosis not present

## 2018-03-02 DIAGNOSIS — F331 Major depressive disorder, recurrent, moderate: Secondary | ICD-10-CM | POA: Diagnosis not present

## 2018-03-02 DIAGNOSIS — F411 Generalized anxiety disorder: Secondary | ICD-10-CM | POA: Diagnosis not present

## 2018-03-02 DIAGNOSIS — T887XXA Unspecified adverse effect of drug or medicament, initial encounter: Secondary | ICD-10-CM | POA: Diagnosis not present

## 2018-03-31 DIAGNOSIS — G4733 Obstructive sleep apnea (adult) (pediatric): Secondary | ICD-10-CM | POA: Diagnosis not present

## 2018-04-13 DIAGNOSIS — F331 Major depressive disorder, recurrent, moderate: Secondary | ICD-10-CM | POA: Diagnosis not present

## 2018-04-13 DIAGNOSIS — T887XXA Unspecified adverse effect of drug or medicament, initial encounter: Secondary | ICD-10-CM | POA: Diagnosis not present

## 2018-04-13 DIAGNOSIS — F411 Generalized anxiety disorder: Secondary | ICD-10-CM | POA: Diagnosis not present

## 2018-05-08 ENCOUNTER — Other Ambulatory Visit: Payer: Self-pay | Admitting: Family Medicine

## 2018-05-08 DIAGNOSIS — E349 Endocrine disorder, unspecified: Secondary | ICD-10-CM

## 2018-06-15 DIAGNOSIS — F331 Major depressive disorder, recurrent, moderate: Secondary | ICD-10-CM | POA: Diagnosis not present

## 2018-06-15 DIAGNOSIS — T887XXA Unspecified adverse effect of drug or medicament, initial encounter: Secondary | ICD-10-CM | POA: Diagnosis not present

## 2018-06-15 DIAGNOSIS — F411 Generalized anxiety disorder: Secondary | ICD-10-CM | POA: Diagnosis not present

## 2018-06-28 DIAGNOSIS — F331 Major depressive disorder, recurrent, moderate: Secondary | ICD-10-CM | POA: Diagnosis not present

## 2018-07-05 DIAGNOSIS — F331 Major depressive disorder, recurrent, moderate: Secondary | ICD-10-CM | POA: Diagnosis not present

## 2018-07-05 DIAGNOSIS — F411 Generalized anxiety disorder: Secondary | ICD-10-CM | POA: Diagnosis not present

## 2018-07-05 DIAGNOSIS — T887XXA Unspecified adverse effect of drug or medicament, initial encounter: Secondary | ICD-10-CM | POA: Diagnosis not present

## 2018-09-15 ENCOUNTER — Encounter: Payer: Self-pay | Admitting: Family Medicine

## 2018-09-15 ENCOUNTER — Ambulatory Visit: Payer: BLUE CROSS/BLUE SHIELD | Admitting: Family Medicine

## 2018-09-15 VITALS — BP 100/60 | HR 80 | Ht 70.0 in | Wt 265.0 lb

## 2018-09-15 DIAGNOSIS — Z23 Encounter for immunization: Secondary | ICD-10-CM

## 2018-09-15 DIAGNOSIS — E349 Endocrine disorder, unspecified: Secondary | ICD-10-CM

## 2018-09-15 DIAGNOSIS — I10 Essential (primary) hypertension: Secondary | ICD-10-CM

## 2018-09-15 MED ORDER — LISINOPRIL 10 MG PO TABS: 10.0000 mg | ORAL_TABLET | Freq: Every day | ORAL | 1 refills | Status: DC

## 2018-09-15 MED ORDER — TESTOSTERONE CYPIONATE 200 MG/ML IM SOLN: INTRAMUSCULAR | 2 refills | Status: AC

## 2018-09-15 NOTE — Progress Notes (Signed)
Date:  09/15/2018   Name:  Steven Carney   DOB:  [birth year]   MRN:  [id number]   Chief Complaint: Hypertension; hypotestosteronism (wants a paper to get labs drawn on Monday); tdap; and Flu Vaccine  Patient present for testosterone supplementation.  Hypertension  This is a chronic problem. The current episode started more than 1 year ago. The problem has been waxing and waning since onset. The problem is controlled. Pertinent negatives include no anxiety, blurred vision, chest pain, headaches, malaise/fatigue, neck pain, orthopnea, palpitations, peripheral edema, PND, shortness of breath or sweats. There are no associated agents to hypertension. There are no known risk factors for coronary artery disease. Past treatments include ACE inhibitors. The current treatment provides moderate improvement. There are no compliance problems.  There is no history of angina, kidney disease, CAD/MI, CVA, heart failure, left ventricular hypertrophy, PVD or retinopathy. There is no history of chronic renal disease, a hypertension causing med or renovascular disease.    Review of Systems  Constitutional: Negative for chills, fever and malaise/fatigue.  HENT: Negative for drooling, ear discharge, ear pain and sore throat.   Eyes: Negative for blurred vision.  Respiratory: Negative for cough, shortness of breath and wheezing.   Cardiovascular: Negative for chest pain, palpitations, orthopnea, leg swelling and PND.  Gastrointestinal: Negative for abdominal pain, blood in stool, constipation, diarrhea and nausea.  Endocrine: Negative for polydipsia.  Genitourinary: Negative for dysuria, frequency, hematuria and urgency.  Musculoskeletal: Negative for back pain, myalgias and neck pain.  Skin: Negative for rash.  Allergic/Immunologic: Negative for environmental allergies.  Neurological: Negative for dizziness and headaches.  Hematological: Does not bruise/bleed easily.  Psychiatric/Behavioral: Negative  for suicidal ideas. The patient is not nervous/anxious.     Patient Active Problem List   Diagnosis Date Noted  . Major depressive disorder, recurrent episode, moderate ([place]) 10/14/2017  . Hypotestosteronemia 03/13/2016  . Taking medication for chronic disease 03/13/2016  . Essential hypertension 07/02/2015    No Known Allergies  Past Surgical History:  Procedure Laterality Date  . ANKLE SURGERY     growth removed  . NOSE SURGERY     fracture    Social History   Tobacco Use  . Smoking status: Former Smoker    Packs/day: 0.50    Types: Cigarettes    Start date: 05/16/1993    Last attempt to quit: 05/16/1998    Years since quitting: 20.3  . Smokeless tobacco: Current User    Types: Snuff  . Tobacco comment: pt doesn't want material- just counseled  Substance Use Topics  . Alcohol use: No    Alcohol/week: 0.0 standard drinks  . Drug use: No     Medication list has been reviewed and updated.  Current Meds  Medication Sig  . buPROPion (WELLBUTRIN SR) 200 MG 12 hr tablet Take 1 tablet by mouth 2 (two) times daily. Transu  . buPROPion HCl ER, XL, 450 MG TB24 Take 1 tablet by mouth daily. psych  . FLUoxetine (PROZAC) 20 MG capsule Take 1 capsule by mouth daily. psych  . lamoTRIgine (LAMICTAL) 150 MG tablet Take 1 tablet by mouth 2 (two) times daily.  Marland Kitchen lisinopril (PRINIVIL,ZESTRIL) 10 MG tablet Take 1 tablet (10 mg total) by mouth daily.  Marland Kitchen testosterone cypionate (DEPOTESTOSTERONE CYPIONATE) 200 MG/ML injection 0.75 cc q wk  . VRAYLAR capsule Take 1.5 mg by mouth daily.     No flowsheet data found.  Physical Exam Vitals signs and nursing note reviewed.  Constitutional:  Appearance: Normal appearance.  HENT:     Head: Normocephalic.     Right Ear: External ear normal.     Left Ear: External ear normal.     Nose: Nose normal.  Eyes:     General: No scleral icterus.       Right eye: No discharge.        Left eye: No discharge.     Conjunctiva/sclera:  Conjunctivae normal.     Pupils: Pupils are equal, round, and reactive to light.  Neck:     Musculoskeletal: Normal range of motion and neck supple.     Thyroid: No thyromegaly.     Vascular: No JVD.     Trachea: No tracheal deviation.  Cardiovascular:     Rate and Rhythm: Normal rate and regular rhythm.     Heart sounds: Normal heart sounds. No murmur. No friction rub. No gallop.   Pulmonary:     Effort: No respiratory distress.     Breath sounds: Normal breath sounds. No wheezing or rales.  Abdominal:     General: Bowel sounds are normal.     Palpations: Abdomen is soft. There is no mass.     Tenderness: There is no abdominal tenderness. There is no guarding or rebound.  Musculoskeletal: Normal range of motion.        General: No tenderness.  Lymphadenopathy:     Cervical: No cervical adenopathy.  Skin:    General: Skin is warm.     Findings: No rash.  Neurological:     Mental Status: He is alert and oriented to person, place, and time.     Cranial Nerves: No cranial nerve deficit.     Deep Tendon Reflexes: Reflexes are normal and symmetric.     BP 100/60   Pulse 80   Ht 5\' 10"  (1.778 m)   Wt 265 lb (120.2 kg)   BMI 38.02 kg/m   Assessment and Plan: 1. Essential hypertension Chronic.  Controlled.  Continue lisinopril 10 mg once a day.  Will check renal panel today. - lisinopril (PRINIVIL,ZESTRIL) 10 MG tablet; Take 1 tablet (10 mg total) by mouth daily.  Dispense: 90 tablet; Refill: 1 - Renal Function Panel  2. Hypotestosteronemia .  Controlled.  Will continue testosterone cypionate injections three quarters of a cc weekly. - testosterone cypionate (DEPOTESTOSTERONE CYPIONATE) 200 MG/ML injection; 0.75 cc q wk  Dispense: 10 mL; Refill: 2  3. Need for Tdap vaccination                                                 discussed and administered - Tdap vaccine greater than or equal to 7yo IM  4. Influenza vaccine needed Discussed and administered - Flu Vaccine QUAD  6+ mos PF IM (Fluarix Quad PF)

## 2018-09-19 DIAGNOSIS — I10 Essential (primary) hypertension: Secondary | ICD-10-CM | POA: Diagnosis not present

## 2018-09-20 LAB — RENAL FUNCTION PANEL
ALBUMIN: 4.7 g/dL (ref 4.0–5.0)
BUN/Creatinine Ratio: 11 (ref 9–20)
BUN: 15 mg/dL (ref 6–24)
CALCIUM: 9.8 mg/dL (ref 8.7–10.2)
CHLORIDE: 102 mmol/L (ref 96–106)
CO2: 24 mmol/L (ref 20–29)
Creatinine, Ser: 1.39 mg/dL — ABNORMAL HIGH (ref 0.76–1.27)
GFR calc Af Amer: 70 mL/min/{1.73_m2} (ref >59)
GFR calc non Af Amer: 61 mL/min/{1.73_m2} (ref >59)
Glucose: 90 mg/dL (ref 65–99)
PHOSPHORUS: 3 mg/dL (ref 2.8–4.1)
Potassium: 4.4 mmol/L (ref 3.5–5.2)
Sodium: 143 mmol/L (ref 134–144)

## 2018-10-06 ENCOUNTER — Telehealth: Payer: Self-pay

## 2018-10-06 NOTE — Telephone Encounter (Signed)
Patient said pharmacy alerted that we denied his refills for Lisinopril. He thinks this may be due to he went to [facility] now? He also has Insurance declining Testosterone and needs that sent to [facility] when it is taken care of. He has a week to go for all these meds.

## 2018-10-07 ENCOUNTER — Other Ambulatory Visit: Payer: Self-pay

## 2018-10-07 DIAGNOSIS — I10 Essential (primary) hypertension: Secondary | ICD-10-CM

## 2018-10-26 DIAGNOSIS — G4733 Obstructive sleep apnea (adult) (pediatric): Secondary | ICD-10-CM | POA: Diagnosis not present

## 2018-11-10 DIAGNOSIS — F3341 Major depressive disorder, recurrent, in partial remission: Secondary | ICD-10-CM | POA: Diagnosis not present

## 2019-01-19 DIAGNOSIS — F3341 Major depressive disorder, recurrent, in partial remission: Secondary | ICD-10-CM | POA: Diagnosis not present

## 2019-01-19 DIAGNOSIS — G4733 Obstructive sleep apnea (adult) (pediatric): Secondary | ICD-10-CM | POA: Diagnosis not present

## 2019-01-19 DIAGNOSIS — Z79899 Other long term (current) drug therapy: Secondary | ICD-10-CM | POA: Diagnosis not present

## 2019-03-27 DIAGNOSIS — H16202 Unspecified keratoconjunctivitis, left eye: Secondary | ICD-10-CM | POA: Diagnosis not present

## 2019-04-21 DIAGNOSIS — F3341 Major depressive disorder, recurrent, in partial remission: Secondary | ICD-10-CM | POA: Diagnosis not present

## 2019-04-21 DIAGNOSIS — G4733 Obstructive sleep apnea (adult) (pediatric): Secondary | ICD-10-CM | POA: Diagnosis not present

## 2019-04-21 DIAGNOSIS — Z79899 Other long term (current) drug therapy: Secondary | ICD-10-CM | POA: Diagnosis not present

## 2019-04-27 DIAGNOSIS — G4733 Obstructive sleep apnea (adult) (pediatric): Secondary | ICD-10-CM | POA: Diagnosis not present

## 2019-04-28 ENCOUNTER — Other Ambulatory Visit: Payer: Self-pay

## 2019-04-28 DIAGNOSIS — I10 Essential (primary) hypertension: Secondary | ICD-10-CM

## 2019-04-28 MED ORDER — LISINOPRIL 10 MG PO TABS: 10.0000 mg | ORAL_TABLET | Freq: Every day | ORAL | 0 refills | Status: AC

## 2019-05-02 ENCOUNTER — Ambulatory Visit: Payer: Self-pay | Admitting: Family Medicine

## 2019-05-16 DIAGNOSIS — I10 Essential (primary) hypertension: Secondary | ICD-10-CM | POA: Diagnosis not present

## 2019-05-16 DIAGNOSIS — Z0000 Encounter for general adult medical examination without abnormal findings: Secondary | ICD-10-CM | POA: Diagnosis not present

## 2019-05-16 DIAGNOSIS — R7989 Other specified abnormal findings of blood chemistry: Secondary | ICD-10-CM | POA: Diagnosis not present

## 2019-05-16 DIAGNOSIS — Z23 Encounter for immunization: Secondary | ICD-10-CM | POA: Diagnosis not present

## 2019-05-19 DIAGNOSIS — E559 Vitamin D deficiency, unspecified: Secondary | ICD-10-CM | POA: Diagnosis not present

## 2019-05-19 DIAGNOSIS — Z1159 Encounter for screening for other viral diseases: Secondary | ICD-10-CM | POA: Diagnosis not present

## 2019-05-19 DIAGNOSIS — Z125 Encounter for screening for malignant neoplasm of prostate: Secondary | ICD-10-CM | POA: Diagnosis not present

## 2019-05-19 DIAGNOSIS — Z0000 Encounter for general adult medical examination without abnormal findings: Secondary | ICD-10-CM | POA: Diagnosis not present

## 2019-05-19 DIAGNOSIS — Z13220 Encounter for screening for lipoid disorders: Secondary | ICD-10-CM | POA: Diagnosis not present

## 2019-05-19 DIAGNOSIS — R7989 Other specified abnormal findings of blood chemistry: Secondary | ICD-10-CM | POA: Diagnosis not present

## 2019-05-19 DIAGNOSIS — Z131 Encounter for screening for diabetes mellitus: Secondary | ICD-10-CM | POA: Diagnosis not present

## 2019-07-20 DIAGNOSIS — G4733 Obstructive sleep apnea (adult) (pediatric): Secondary | ICD-10-CM | POA: Diagnosis not present

## 2019-07-20 DIAGNOSIS — F3341 Major depressive disorder, recurrent, in partial remission: Secondary | ICD-10-CM | POA: Diagnosis not present

## 2019-07-20 DIAGNOSIS — Z79899 Other long term (current) drug therapy: Secondary | ICD-10-CM | POA: Diagnosis not present

## 2019-10-21 ENCOUNTER — Ambulatory Visit: Payer: Self-pay | Attending: Internal Medicine

## 2019-10-21 ENCOUNTER — Other Ambulatory Visit: Payer: Self-pay

## 2019-10-21 DIAGNOSIS — Z23 Encounter for immunization: Secondary | ICD-10-CM | POA: Insufficient documentation

## 2019-10-21 NOTE — Progress Notes (Signed)
   Covid-19 Vaccination Clinic  Name:  Steven Carney    MRN: [id number] DOB: Feb 07, 1971  10/21/2019  Mr. [name] was observed post Covid-19 immunization for 15 minutes without incident. He was provided with Vaccine Information Sheet and instruction to access the V-Safe system.   Mr. [name] was instructed to call 911 with any severe reactions post vaccine: Marland Kitchen Difficulty breathing  . Swelling of face and throat  . A fast heartbeat  . A bad rash all over body  . Dizziness and weakness   Immunizations Administered    Name Date Dose VIS Date Route   Pfizer COVID-19 Vaccine 10/21/2019  9:31 AM 0.3 mL 07/28/2019 Intramuscular   Manufacturer: [place]   Lot: [id number]   [place]: [id number]

## 2019-11-14 ENCOUNTER — Ambulatory Visit: Payer: Self-pay | Attending: Internal Medicine

## 2019-11-14 DIAGNOSIS — Z23 Encounter for immunization: Secondary | ICD-10-CM

## 2019-11-14 NOTE — Progress Notes (Signed)
   Covid-19 Vaccination Clinic  Name:  Steven Carney    MRN: [id number] DOB: 04-Sep-1970  11/14/2019  [name] was observed post Covid-19 immunization for 15 minutes without incident. He was provided with Vaccine Information Sheet and instruction to access the V-Safe system.   [name] was instructed to call 911 with any severe reactions post vaccine: Marland Kitchen Difficulty breathing  . Swelling of face and throat  . A fast heartbeat  . A bad rash all over body  . Dizziness and weakness   Immunizations Administered    Name Date Dose VIS Date Route   Pfizer COVID-19 Vaccine 11/14/2019  8:05 AM 0.3 mL 07/28/2019 Intramuscular   Manufacturer: [place]   Lot: [id number]   [place]: [id number]

## 2021-08-21 ENCOUNTER — Other Ambulatory Visit: Payer: Self-pay | Admitting: General Surgery

## 2021-08-22 LAB — SURGICAL PATHOLOGY

## 2023-11-27 ENCOUNTER — Encounter: Payer: Self-pay | Admitting: Intensive Care

## 2023-11-27 ENCOUNTER — Emergency Department
Admission: EM | Admit: 2023-11-27 | Discharge: 2023-11-27 | Disposition: A | Discharge status: Home / Self Care | Attending: Emergency Medicine | Admitting: Emergency Medicine

## 2023-11-27 ENCOUNTER — Emergency Department

## 2023-11-27 ENCOUNTER — Other Ambulatory Visit: Payer: Self-pay

## 2023-11-27 DIAGNOSIS — Z7901 Long term (current) use of anticoagulants: Secondary | ICD-10-CM | POA: Insufficient documentation

## 2023-11-27 DIAGNOSIS — W268XXA Contact with other sharp object(s), not elsewhere classified, initial encounter: Secondary | ICD-10-CM | POA: Insufficient documentation

## 2023-11-27 DIAGNOSIS — S91312A Laceration without foreign body, left foot, initial encounter: Secondary | ICD-10-CM | POA: Diagnosis present

## 2023-11-27 DIAGNOSIS — I10 Essential (primary) hypertension: Secondary | ICD-10-CM | POA: Insufficient documentation

## 2023-11-27 DIAGNOSIS — Z23 Encounter for immunization: Secondary | ICD-10-CM | POA: Insufficient documentation

## 2023-11-27 MED ORDER — TETANUS-DIPHTH-ACELL PERTUSSIS 5-2.5-18.5 LF-MCG/0.5 IM SUSY
0.5000 mL | PREFILLED_SYRINGE | Freq: Once | INTRAMUSCULAR | Status: AC
  Administered 2023-11-27: 0.5 mL via INTRAMUSCULAR
  Filled 2023-11-27: qty 0.5

## 2023-11-27 MED ORDER — LIDOCAINE-EPINEPHRINE-TETRACAINE (LET) TOPICAL GEL
3.0000 mL | Freq: Once | TOPICAL | Status: AC
  Administered 2023-11-27: 3 mL via TOPICAL
  Filled 2023-11-27: qty 3

## 2023-11-27 MED ORDER — CEPHALEXIN 500 MG PO CAPS: 500.0000 mg | ORAL_CAPSULE | Freq: Three times a day (TID) | ORAL | 0 refills | Status: AC

## 2023-11-27 MED ORDER — LIDOCAINE HCL (PF) 1 % IJ SOLN
5.0000 mL | Freq: Once | INTRAMUSCULAR | Status: AC
  Administered 2023-11-27: 5 mL
  Filled 2023-11-27: qty 5

## 2023-11-27 NOTE — ED Notes (Signed)
 Nonstick drsg applied LLE; wrapped with gauze. Post op shoe applied

## 2023-11-27 NOTE — ED Triage Notes (Signed)
 Patient presents with left foot laceration. Wrapped with towel and currently still oozing blood

## 2023-11-27 NOTE — ED Provider Notes (Signed)
 [facility] Provider Note    Event Date/Time   First MD Initiated Contact with Patient 11/27/23 1542     (approximate)   History   Extremity Laceration   HPI  Steven Carney is a 53 y.o. male history of hypertension, presents emergency department with laceration to the left foot.  Patient states he stepped into a metal air bit great.  He states it is a homemade grate not when he could buy at [employer].  Resulted in a laceration to the top of his foot.  No loss of motion.  Unsure of how many cuts.  Patient is on Eliquis.  States his mother who is a Engineer, civil (consulting) wrapped it up for him.  Unsure of his last Tdap      Physical Exam   Triage Vital Signs: ED Triage Vitals [11/27/23 1535]  Encounter Vitals Group     BP (!) 141/100     Systolic BP Percentile      Diastolic BP Percentile      Pulse Rate (!) 105     Resp 18     Temp 98.4 F (36.9 C)     Temp Source Oral     SpO2 97 %     Weight 270 lb (122.5 kg)     Height 5\' 10"  (1.778 m)     Head Circumference      Peak Flow      Pain Score 2     Pain Loc      Pain Education      Exclude from Growth Chart     Most recent vital signs: Vitals:   11/27/23 1535  BP: (!) 141/100  Pulse: (!) 105  Resp: 18  Temp: 98.4 F (36.9 C)  SpO2: 97%     General: Awake, no distress.   CV:  Good peripheral perfusion. regular rate and  rhythm Resp:  Normal effort.  Abd:  No distention.   Other:  Left foot with laceration noted below the left great toe, full range of motion, neurovascular is intact, no foreign body noted   ED Results / Procedures / Treatments   Labs (all labs ordered are listed, but only abnormal results are displayed) Labs Reviewed - No data to display   EKG     RADIOLOGY X-ray left foot    PROCEDURES:   .Laceration Repair  Date/Time: 11/27/2023 5:06 PM  Performed by: Delsie Figures, PA-C Authorized by: Delsie Figures, PA-C   Consent:    Consent obtained:  Verbal    Consent given by:  Patient   Risks, benefits, and alternatives were discussed: yes     Risks discussed:  Infection, pain, retained foreign body, tendon damage, poor cosmetic result, need for additional repair, nerve damage, poor wound healing and vascular damage   Alternatives discussed:  No treatment Universal protocol:    Procedure explained and questions answered to patient or proxy's satisfaction: yes     Immediately prior to procedure, a time out was called: yes     Patient identity confirmed:  Verbally with patient Anesthesia:    Anesthesia method:  Topical application and local infiltration   Topical anesthetic:  LET   Local anesthetic:  Lidocaine 1% w/o epi Laceration details:    Location:  Foot   Foot location:  Top of L foot   Length (cm):  5.5 Pre-procedure details:    Preparation:  Patient was prepped and draped in usual sterile fashion and imaging obtained to evaluate  for foreign bodies Exploration:    Limited defect created (wound extended): no     Hemostasis achieved with:  Direct pressure and LET   Imaging obtained: x-ray     Imaging outcome: foreign body not noted     Wound exploration: wound explored through full range of motion and entire depth of wound visualized     Wound extent: areolar tissue not violated, fascia not violated, no foreign body, no signs of injury, no nerve damage, no tendon damage, no underlying fracture and no vascular damage     Contaminated: no   Treatment:    Area cleansed with:  Povidone-iodine and saline   Amount of cleaning:  Standard   Irrigation solution:  Sterile saline   Irrigation method:  Syringe and tap   Debridement:  None   Undermining:  None   Scar revision: no   Skin repair:    Repair method:  Sutures   Suture size:  4-0   Suture material:  Nylon   Suture technique:  Simple interrupted   Number of sutures:  12 Approximation:    Approximation:  Close Repair type:    Repair type:  Simple Post-procedure details:     Dressing:  Non-adherent dressing and splint for protection   Procedure completion:  Tolerated well, no immediate complications  Chief Complaint  Patient presents with   Extremity Laceration      MEDICATIONS ORDERED IN ED: Medications  lidocaine-EPINEPHrine-tetracaine (LET) topical gel (3 mLs Topical Given 11/27/23 1548)  lidocaine (PF) (XYLOCAINE) 1 % injection 5 mL (5 mLs Infiltration Given 11/27/23 1552)  Tdap (BOOSTRIX) injection 0.5 mL (0.5 mLs Intramuscular Given 11/27/23 1553)     IMPRESSION / MDM / ASSESSMENT AND PLAN / ED COURSE  I reviewed the triage vital signs and the nursing notes.                              Differential diagnosis includes, but is not limited to, laceration, abrasion, tendon injury, fracture  Patient's presentation is most consistent with acute illness / injury with system symptoms.   X-ray of the left foot  L ET applied  See procedure note for laceration repair  X-ray of the left foot independently reviewed interpreted by me as being negative for any acute abnormality, confirmed by radiology   I did not see evidence of any tendon injury and patient has full range of motion.  However due to the placement of the laceration I did instruct the patient to follow-up with podiatry.  If he was starts to lose range of motion of the left great toe he may have a tendon injury that we cannot see with the naked eye.  Patient is in agreement with this treatment plan.  Suture care instructions discussed.  Discharged in stable condition.  Tdap updated here in the ED.   FINAL CLINICAL IMPRESSION(S) / ED DIAGNOSES   Final diagnoses:  Foot laceration, left, initial encounter     Rx / DC Orders   ED Discharge Orders          Ordered    cephALEXin (KEFLEX) 500 MG capsule  3 times daily        11/27/23 1653             Note:  This document was prepared using Dragon voice recognition software and may include unintentional dictation errors.     Delsie Figures, PA-C 11/27/23 1708    Mumma,  Cathleen Coach, MD 11/27/23 2016

## 2023-11-27 NOTE — Discharge Instructions (Signed)
 Follow-up with your regular doctor in 10 to 14 days for suture removal.  Keep the foot elevated is much as possible to aid in healing.  Keep the bandage on for 24 hours.  After that you may shower but do not soak the foot down in water as in a tub.  Wear a dressing if the area continues to ooze.  When at home you can leave the area open for oxygens to help the foot heal faster.  Take the antibiotic as prescribed.  Your tetanus is good for 10 years unless you get hurt and it decreases to 5-7.  If you lose any range of motion of your toe please follow-up with orthopedics.  Their phone number is attached.
